# Patient Record
Sex: Female | Born: 1980
Health system: Southern US, Community
[De-identification: ages and names within clinical notes are randomized; demographics above are authoritative.]

## PROBLEM LIST (undated history)

## (undated) ENCOUNTER — Inpatient Hospital Stay (HOSPITAL_COMMUNITY): Payer: Self-pay

## (undated) ENCOUNTER — Emergency Department (HOSPITAL_COMMUNITY): Discharge status: Absent without leave | Payer: 59

## (undated) DIAGNOSIS — I10 Essential (primary) hypertension: Secondary | ICD-10-CM

## (undated) DIAGNOSIS — A6000 Herpesviral infection of urogenital system, unspecified: Secondary | ICD-10-CM

## (undated) DIAGNOSIS — O24419 Gestational diabetes mellitus in pregnancy, unspecified control: Secondary | ICD-10-CM

## (undated) DIAGNOSIS — J45909 Unspecified asthma, uncomplicated: Secondary | ICD-10-CM

## (undated) HISTORY — PX: MOUTH SURGERY: SHX715

---

## 2013-12-03 ENCOUNTER — Emergency Department (INDEPENDENT_AMBULATORY_CARE_PROVIDER_SITE_OTHER)
Admission: EM | Admit: 2013-12-03 | Discharge: 2013-12-03 | Disposition: A | Discharge status: Home / Self Care | Payer: Medicaid Other | Source: Home / Self Care | Attending: Family Medicine | Admitting: Family Medicine

## 2013-12-03 ENCOUNTER — Other Ambulatory Visit (HOSPITAL_COMMUNITY)
Admission: RE | Admit: 2013-12-03 | Discharge: 2013-12-03 | Disposition: A | Discharge status: Home / Self Care | Payer: Medicaid Other | Source: Ambulatory Visit | Attending: Family Medicine | Admitting: Family Medicine

## 2013-12-03 ENCOUNTER — Encounter (HOSPITAL_COMMUNITY): Payer: Self-pay | Admitting: Emergency Medicine

## 2013-12-03 DIAGNOSIS — Z202 Contact with and (suspected) exposure to infections with a predominantly sexual mode of transmission: Secondary | ICD-10-CM

## 2013-12-03 DIAGNOSIS — Z113 Encounter for screening for infections with a predominantly sexual mode of transmission: Secondary | ICD-10-CM | POA: Insufficient documentation

## 2013-12-03 DIAGNOSIS — N760 Acute vaginitis: Secondary | ICD-10-CM | POA: Insufficient documentation

## 2013-12-03 HISTORY — DX: Essential (primary) hypertension: I10

## 2013-12-03 LAB — POCT URINALYSIS DIP (DEVICE)
Bilirubin Urine: NEGATIVE
GLUCOSE, UA: NEGATIVE mg/dL
KETONES UR: NEGATIVE mg/dL
LEUKOCYTES UA: NEGATIVE
Nitrite: NEGATIVE
PROTEIN: NEGATIVE mg/dL
Specific Gravity, Urine: >=1.03 (ref 1.005–1.030)
Urobilinogen, UA: 1 mg/dL (ref 0.0–1.0)
pH: 6 (ref 5.0–8.0)

## 2013-12-03 LAB — POCT PREGNANCY, URINE: Preg Test, Ur: NEGATIVE

## 2013-12-03 MED ORDER — AZITHROMYCIN 250 MG PO TABS
1000.0000 mg | ORAL_TABLET | Freq: Every day | ORAL | Status: DC
  Administered 2013-12-03: 1000 mg via ORAL

## 2013-12-03 MED ORDER — CEFTRIAXONE SODIUM 250 MG IJ SOLR
250.0000 mg | Freq: Once | INTRAMUSCULAR | Status: AC
  Administered 2013-12-03: 250 mg via INTRAMUSCULAR

## 2013-12-03 MED ORDER — AZITHROMYCIN 250 MG PO TABS
ORAL_TABLET | ORAL | Status: AC
  Filled 2013-12-03: qty 4

## 2013-12-03 MED ORDER — CEFTRIAXONE SODIUM 250 MG IJ SOLR
INTRAMUSCULAR | Status: AC
  Filled 2013-12-03: qty 250

## 2013-12-03 MED ORDER — LIDOCAINE HCL (PF) 1 % IJ SOLN
INTRAMUSCULAR | Status: AC
  Filled 2013-12-03: qty 5

## 2013-12-03 NOTE — Discharge Instructions (Signed)
Sexually Transmitted Disease A sexually transmitted disease (STD) is a disease or infection that may be passed (transmitted) from person to person, usually during sexual activity. This may happen by way of saliva, semen, blood, vaginal mucus, or urine. Common STDs include:   Gonorrhea.   Chlamydia.   Syphilis.   HIV and AIDS.   Genital herpes.   Hepatitis B and C.   Trichomonas.   Human papillomavirus (HPV).   Pubic lice.   Scabies.  Mites.  Bacterial vaginosis. WHAT ARE CAUSES OF STDs? An STD may be caused by bacteria, a virus, or parasites. STDs are often transmitted during sexual activity if one person is infected. However, they may also be transmitted through nonsexual means. STDs may be transmitted after:   Sexual intercourse with an infected person.   Sharing sex toys with an infected person.   Sharing needles with an infected person or using unclean piercing or tattoo needles.  Having intimate contact with the genitals, mouth, or rectal areas of an infected person.   Exposure to infected fluids during birth. WHAT ARE THE SIGNS AND SYMPTOMS OF STDs? Different STDs have different symptoms. Some people may not have any symptoms. If symptoms are present, they may include:   Painful or bloody urination.   Pain in the pelvis, abdomen, vagina, anus, throat, or eyes.   Skin rash, itching, irritation, growths, sores (lesions), ulcerations, or warts in the genital or anal area.  Abnormal vaginal discharge with or without bad odor.   Penile discharge in men.   Fever.   Pain or bleeding during sexual intercourse.   Swollen glands in the groin area.   Yellow skin and eyes (jaundice). This is seen with hepatitis.   Swollen testicles.  Infertility.  Sores and blisters in the mouth. HOW ARE STDs DIAGNOSED? To make a diagnosis, your health care provider may:   Take a medical history.   Perform a physical exam.   Take a sample of any  discharge for examination.  Swab the throat, cervix, opening to the penis, rectum, or vagina for testing.  Test a sample of your first morning urine.   Perform blood tests.   Perform a Pap smear, if this applies.   Perform a colposcopy.   Perform a laparoscopy.  HOW ARE STDs TREATED? Treatment depends on the STD. Some STDs may be treated but not cured.   Chlamydia, gonorrhea, trichomonas, and syphilis can be cured with antibiotics.   Genital herpes, hepatitis, and HIV can be treated, but not cured, with prescribed medicines. The medicines lessen symptoms.   Genital warts from HPV can be treated with medicine or by freezing, burning (electrocautery), or surgery. Warts may come back.   HPV cannot be cured with medicine or surgery. However, abnormal areas may be removed from the cervix, vagina, or vulva.   If your diagnosis is confirmed, your recent sexual partners need treatment. This is true even if they are symptom-free or have a negative culture or evaluation. They should not have sex until their health care providers say it is OK. HOW CAN I REDUCE MY RISK OF GETTING AN STD?  Use latex condoms, dental dams, and water-soluble lubricants during sexual activity. Do not use petroleum jelly or oils.  Get vaccinated for HPV and hepatitis. If you have not received these vaccines in the past, talk to your health care provider about whether one or both might be right for you.   Avoid risky sex practices that can break the skin.  WHAT SHOULD   I DO IF I THINK I HAVE AN STD?  See your health care provider.   Inform all sexual partners. They should be tested and treated for any STDs.  Do not have sex until your health care provider says it is OK. WHEN SHOULD I GET HELP? Seek immediate medical care if:  You develop severe abdominal pain.  You are a man and notice swelling or pain in the testicles.  You are a woman and notice swelling or pain in your vagina. Document  Released: 11/06/2002 Document Revised: 06/06/2013 Document Reviewed: 03/06/2013 [employer] Patient Information 2014 [employer].  

## 2013-12-03 NOTE — ED Notes (Signed)
Reports being told by her partner that he tested positive for an std.    Pt is c/o a discharge and odor.  Denies any other symptoms.  X 2 days.

## 2013-12-03 NOTE — ED Provider Notes (Signed)
CSN: [id number]     Arrival date & time 12/03/13  1757 History   First MD Initiated Contact with Patient 12/03/13 1852     Chief Complaint  Patient presents with  . Exposure to STD   (Consider location/radiation/quality/duration/timing/severity/associated sxs/prior Treatment) HPI Comments: 33 year old female presents for evaluation after being told by her partner that he tested positive for an STD.  This conversation occurred earlier today. He did not tell her what he tested positive for. She has noted a slight malodorous vaginal discharge, but she states she just finished her period and this occurs after every period. She also has noted some mild lower abdominal pain but she just started working out so she is reassessed the abdominal pain to muscular soreness. She denies fever, chills, NVD. Denies vaginal bleeding.  Patient is a 33 y.o. female presenting with STD exposure.  Exposure to STD Associated symptoms include abdominal pain.    Past Medical History  Diagnosis Date  . Hypertension    History reviewed. No pertinent past surgical history. History reviewed. No pertinent family history. History  Substance Use Topics  . Smoking status: Current Every Day Smoker -- 0.50 packs/day    Types: Cigarettes  . Smokeless tobacco: Not on file  . Alcohol Use: Yes   OB History   Grav Para Term Preterm Abortions TAB SAB Ect Mult Living                 Review of Systems  Constitutional: Negative for fever and chills.  Gastrointestinal: Positive for abdominal pain. Negative for nausea, vomiting and diarrhea.  Genitourinary: Positive for vaginal discharge. Negative for dysuria, urgency, frequency, genital sores, vaginal pain and pelvic pain.  All other systems reviewed and are negative.    Allergies  Review of patient's allergies indicates no known allergies.  Home Medications   Current Outpatient Rx  Name  Route  Sig  Dispense  Refill  . HYDROCHLOROTHIAZIDE PO   Oral   Take by  mouth.          BP 181/99  Pulse 64  Temp(Src) 97.5 F (36.4 C) (Oral)  Resp 16  SpO2 100%  LMP 11/24/2013 Physical Exam  Nursing note and vitals reviewed. Constitutional: She is oriented to person, place, and time. Vital signs are normal. She appears well-developed and well-nourished. No distress.  HENT:  Head: Normocephalic and atraumatic.  Pulmonary/Chest: Effort normal. No respiratory distress.  Abdominal: Soft. She exhibits no distension and no mass. There is no tenderness. There is no rebound and no guarding.  Genitourinary: There is no tenderness or lesion on the right labia. There is no tenderness or lesion on the left labia. Cervix exhibits no motion tenderness, no discharge and no friability. Right adnexum displays no mass, no tenderness and no fullness. Left adnexum displays no mass, no tenderness and no fullness. No erythema or bleeding around the vagina. No foreign body around the vagina. Vaginal discharge (very slight thin white discharge, may be physiologic) found.  Lymphadenopathy:       Right: No inguinal adenopathy present.       Left: No inguinal adenopathy present.  Neurological: She is alert and oriented to person, place, and time. She has normal strength. Coordination normal.  Skin: Skin is warm and dry. No rash noted. She is not diaphoretic.  Psychiatric: She has a normal mood and affect. Judgment normal.    ED Course  Procedures (including critical care time) Labs Review Labs Reviewed  POCT URINALYSIS DIP (DEVICE) - Abnormal; Notable  for the following:    Hgb urine dipstick SMALL (*)    All other components within normal limits  RPR  HIV ANTIBODY (ROUTINE TESTING)  POCT PREGNANCY, URINE  CERVICOVAGINAL ANCILLARY ONLY   Imaging Review No results found.   MDM   1. Exposure to STD    We'll treat with Rocephin and azithromycin for post exposure prophylaxis. Sending cytology, HIV, RPR. Followup when necessary  Meds ordered this encounter   Medications  . azithromycin (ZITHROMAX) tablet 1,000 mg    Sig:   . cefTRIAXone (ROCEPHIN) injection 250 mg    Sig:      Liam Graham, PA-C 12/03/13 1911

## 2013-12-03 NOTE — ED Provider Notes (Signed)
Medical screening examination/treatment/procedure(s) were performed by resident physician or non-physician practitioner and as supervising physician I was immediately available for consultation/collaboration.   Pauline Good MD.   Billy Fischer, MD 12/03/13 2126

## 2013-12-04 LAB — CERVICOVAGINAL ANCILLARY ONLY
CHLAMYDIA, DNA PROBE: NEGATIVE
NEISSERIA GONORRHEA: NEGATIVE
WET PREP (BD AFFIRM): NEGATIVE
Wet Prep (BD Affirm): NEGATIVE
Wet Prep (BD Affirm): POSITIVE — AB

## 2013-12-04 LAB — RPR: RPR Ser Ql: NONREACTIVE

## 2013-12-05 NOTE — ED Notes (Signed)
GC/Chlamydia neg., Affirm: Candida and Trich neg., Gardnerella pos., RPR non-reactive, HIV in process. 4/7 Message sent to [employer] PA. Hanley Seamen [facility] [place] 12/05/2013

## 2013-12-06 ENCOUNTER — Telehealth (HOSPITAL_COMMUNITY): Payer: Self-pay | Admitting: *Deleted

## 2013-12-06 ENCOUNTER — Telehealth (HOSPITAL_COMMUNITY): Payer: Self-pay | Admitting: Emergency Medicine

## 2013-12-06 LAB — HIV ANTIBODY (ROUTINE TESTING W REFLEX): HIV: NONREACTIVE

## 2013-12-06 MED ORDER — METRONIDAZOLE 500 MG PO TABS: 500.0000 mg | ORAL_TABLET | Freq: Two times a day (BID) | ORAL | Status: DC

## 2013-12-06 NOTE — Telephone Encounter (Signed)
Natale Milch, RN received lab for pt positive for Gardnerella. Pt was not tx. Rx flagyl 500mg  BID #[id number]. S. York, RN will contact pt to pick up rx.

## 2013-12-06 NOTE — ED Notes (Signed)
Order obtained from Willeen Cass NP for Flagyl.  I called pt. and left a message to call. Sylvia Baxter 12/06/2013

## 2013-12-06 NOTE — ED Notes (Addendum)
Pt. called back.  Pt. verified x 2 and given results. Pt. said she can see her results on my chart except the HIV. I told her it was still in process. If it comes back pos. I would notify her.  I told her she needs Flagyl for bacterial vaginosis.  Pt. wants Rx. called to [facility] on [address].   Pt. instructed to no alcohol while taking this medication.  I told her it should be ready in about an hour.  Rx. called to pharmacist @ [phone]. [name] 12/06/2013 HIV non-reactive. 12/06/2013

## 2014-08-12 ENCOUNTER — Encounter (HOSPITAL_COMMUNITY): Payer: Self-pay | Admitting: Emergency Medicine

## 2014-08-12 ENCOUNTER — Other Ambulatory Visit (HOSPITAL_COMMUNITY)
Admission: RE | Admit: 2014-08-12 | Discharge: 2014-08-12 | Disposition: A | Discharge status: Home / Self Care | Payer: 59 | Source: Ambulatory Visit | Attending: Emergency Medicine | Admitting: Emergency Medicine

## 2014-08-12 ENCOUNTER — Emergency Department (INDEPENDENT_AMBULATORY_CARE_PROVIDER_SITE_OTHER)
Admission: EM | Admit: 2014-08-12 | Discharge: 2014-08-12 | Disposition: A | Discharge status: Home / Self Care | Payer: 59 | Source: Home / Self Care | Attending: Emergency Medicine | Admitting: Emergency Medicine

## 2014-08-12 DIAGNOSIS — B009 Herpesviral infection, unspecified: Secondary | ICD-10-CM

## 2014-08-12 DIAGNOSIS — N760 Acute vaginitis: Secondary | ICD-10-CM

## 2014-08-12 DIAGNOSIS — J4520 Mild intermittent asthma, uncomplicated: Secondary | ICD-10-CM

## 2014-08-12 DIAGNOSIS — Z113 Encounter for screening for infections with a predominantly sexual mode of transmission: Secondary | ICD-10-CM | POA: Insufficient documentation

## 2014-08-12 DIAGNOSIS — I10 Essential (primary) hypertension: Secondary | ICD-10-CM

## 2014-08-12 HISTORY — DX: Herpesviral infection of urogenital system, unspecified: A60.00

## 2014-08-12 LAB — POCT URINALYSIS DIP (DEVICE)
Bilirubin Urine: NEGATIVE
Glucose, UA: NEGATIVE mg/dL
KETONES UR: NEGATIVE mg/dL
Nitrite: NEGATIVE
PROTEIN: 30 mg/dL — AB
Specific Gravity, Urine: >=1.03 (ref 1.005–1.030)
Urobilinogen, UA: 1 mg/dL (ref 0.0–1.0)
pH: 6 (ref 5.0–8.0)

## 2014-08-12 LAB — POCT PREGNANCY, URINE: PREG TEST UR: NEGATIVE

## 2014-08-12 MED ORDER — ALBUTEROL SULFATE HFA 108 (90 BASE) MCG/ACT IN AERS: 2.0000 | INHALATION_SPRAY | RESPIRATORY_TRACT | Status: AC | PRN

## 2014-08-12 MED ORDER — METRONIDAZOLE 500 MG PO TABS: 500.0000 mg | ORAL_TABLET | Freq: Two times a day (BID) | ORAL | Status: DC

## 2014-08-12 MED ORDER — VALACYCLOVIR HCL 1 G PO TABS: 500.0000 mg | ORAL_TABLET | Freq: Two times a day (BID) | ORAL | Status: DC

## 2014-08-12 MED ORDER — HYDROCHLOROTHIAZIDE 25 MG PO TABS: 25.0000 mg | ORAL_TABLET | Freq: Every day | ORAL | Status: DC

## 2014-08-12 MED ORDER — FLUCONAZOLE 150 MG PO TABS: 150.0000 mg | ORAL_TABLET | Freq: Once | ORAL | Status: DC

## 2014-08-12 NOTE — Discharge Instructions (Signed)
Asthma Attack Prevention Although there is no way to prevent asthma from starting, you can take steps to control the disease and reduce its symptoms. Learn about your asthma and how to control it. Take an active role to control your asthma by working with your health care provider to create and follow an asthma action plan. An asthma action plan guides you in:  Taking your medicines properly.  Avoiding things that set off your asthma or make your asthma worse (asthma triggers).  Tracking your level of asthma control.  Responding to worsening asthma.  Seeking emergency care when needed. To track your asthma, keep records of your symptoms, check your peak flow number using a handheld device that shows how well air moves out of your lungs (peak flow meter), and get regular asthma checkups.  WHAT ARE SOME WAYS TO PREVENT AN ASTHMA ATTACK?  Take medicines as directed by your health care provider.  Keep track of your asthma symptoms and level of control.  With your health care provider, write a detailed plan for taking medicines and managing an asthma attack. Then be sure to follow your action plan. Asthma is an ongoing condition that needs regular monitoring and treatment.  Identify and avoid asthma triggers. Many outdoor allergens and irritants (such as pollen, mold, cold air, and air pollution) can trigger asthma attacks. Find out what your asthma triggers are and take steps to avoid them.  Monitor your breathing. Learn to recognize warning signs of an attack, such as coughing, wheezing, or shortness of breath. Your lung function may decrease before you notice any signs or symptoms, so regularly measure and record your peak airflow with a home peak flow meter.  Identify and treat attacks early. If you act quickly, you are less likely to have a severe attack. You will also need less medicine to control your symptoms. When your peak flow measurements decrease and alert you to an upcoming attack,  take your medicine as instructed and immediately stop any activity that may have triggered the attack. If your symptoms do not improve, get medical help.  Pay attention to increasing quick-relief inhaler use. If you find yourself relying on your quick-relief inhaler, your asthma is not under control. See your health care provider about adjusting your treatment. WHAT CAN MAKE MY SYMPTOMS WORSE? A number of common things can set off or make your asthma symptoms worse and cause temporary increased inflammation of your airways. Keep track of your asthma symptoms for several weeks, detailing all the environmental and emotional factors that are linked with your asthma. When you have an asthma attack, go back to your asthma diary to see which factor, or combination of factors, might have contributed to it. Once you know what these factors are, you can take steps to control many of them. If you have allergies and asthma, it is important to take asthma prevention steps at home. Minimizing contact with the substance to which you are allergic will help prevent an asthma attack. Some triggers and ways to avoid these triggers are: Animal Dander:  Some people are allergic to the flakes of skin or dried saliva from animals with fur or feathers.   There is no such thing as a hypoallergenic dog or cat breed. All dogs or cats can cause allergies, even if they don't shed.  Keep these pets out of your home.  If you are not able to keep a pet outdoors, keep the pet out of your bedroom and other sleeping areas at all  times, and keep the door closed.  Remove carpets and furniture covered with cloth from your home. If that is not possible, keep the pet away from fabric-covered furniture and carpets. Dust Mites: Many people with asthma are allergic to dust mites. Dust mites are tiny bugs that are found in every home in mattresses, pillows, carpets, fabric-covered furniture, bedcovers, clothes, stuffed toys, and other  fabric-covered items.   Cover your mattress in a special dust-proof cover.  Cover your pillow in a special dust-proof cover, or wash the pillow each week in hot water. Water must be hotter than 130 F (54.4 C) to kill dust mites. Cold or warm water used with detergent and bleach can also be effective.  Wash the sheets and blankets on your bed each week in hot water.  Try not to sleep or lie on cloth-covered cushions.  Call ahead when traveling and ask for a smoke-free hotel room. Bring your own bedding and pillows in case the hotel only supplies feather pillows and down comforters, which may contain dust mites and cause asthma symptoms.  Remove carpets from your bedroom and those laid on concrete, if you can.  Keep stuffed toys out of the bed, or wash the toys weekly in hot water or cooler water with detergent and bleach. Cockroaches: Many people with asthma are allergic to the droppings and remains of cockroaches.   Keep food and garbage in closed containers. Never leave food out.  Use poison baits, traps, powders, gels, or paste (for example, boric acid).  If a spray is used to kill cockroaches, stay out of the room until the odor goes away. Indoor Mold: 1. Fix leaky faucets, pipes, or other sources of water that have mold around them. 2. Clean floors and moldy surfaces with a fungicide or diluted bleach. 3. Avoid using humidifiers, vaporizers, or swamp coolers. These can spread molds through the air. Pollen and Outdoor Mold: 1. When pollen or mold spore counts are high, try to keep your windows closed. 2. Stay indoors with windows closed from late morning to afternoon. Pollen and some mold spore counts are highest at that time. 3. Ask your health care provider whether you need to take anti-inflammatory medicine or increase your dose of the medicine before your allergy season starts. Other Irritants to Avoid:  Tobacco smoke is an irritant. If you smoke, ask your health care  provider how you can quit. Ask family members to quit smoking, too. Do not allow smoking in your home or car.  If possible, do not use a wood-burning stove, kerosene heater, or fireplace. Minimize exposure to all sources of smoke, including incense, candles, fires, and fireworks.  Try to stay away from strong odors and sprays, such as perfume, talcum powder, hair spray, and paints.  Decrease humidity in your home and use an indoor air cleaning device. Reduce indoor humidity to below 60%. Dehumidifiers or central air conditioners can do this.  Decrease house dust exposure by changing furnace and air cooler filters frequently.  Try to have someone else vacuum for you once or twice a week. Stay out of rooms while they are being vacuumed and for a short while afterward.  If you vacuum, use a dust mask from a hardware store, a double-layered or microfilter vacuum cleaner bag, or a vacuum cleaner with a HEPA filter.  Sulfites in foods and beverages can be irritants. Do not drink beer or wine or eat dried fruit, processed potatoes, or shrimp if they cause asthma symptoms.  Cold  air can trigger an asthma attack. Cover your nose and mouth with a scarf on cold or windy days.  Several health conditions can make asthma more difficult to manage, including a runny nose, sinus infections, reflux disease, psychological stress, and sleep apnea. Work with your health care provider to manage these conditions.  Avoid close contact with people who have a respiratory infection such as a cold or the flu, since your asthma symptoms may get worse if you catch the infection. Wash your hands thoroughly after touching items that may have been handled by people with a respiratory infection.  Get a flu shot every year to protect against the flu virus, which often makes asthma worse for days or weeks. Also get a pneumonia shot if you have not previously had one. Unlike the flu shot, the pneumonia shot does not need to be  given yearly. Medicines:  Talk to your health care provider about whether it is safe for you to take aspirin or non-steroidal anti-inflammatory medicines (NSAIDs). In a small number of people with asthma, aspirin and NSAIDs can cause asthma attacks. These medicines must be avoided by people who have known aspirin-sensitive asthma. It is important that people with aspirin-sensitive asthma read labels of all over-the-counter medicines used to treat pain, colds, coughs, and fever.  Beta-blockers and ACE inhibitors are other medicines you should discuss with your health care provider. HOW CAN I FIND OUT WHAT I AM ALLERGIC TO? Ask your asthma health care provider about allergy skin testing or blood testing (the RAST test) to identify the allergens to which you are sensitive. If you are found to have allergies, the most important thing to do is to try to avoid exposure to any allergens that you are sensitive to as much as possible. Other treatments for allergies, such as medicines and allergy shots (immunotherapy) are available.  CAN I EXERCISE? Follow your health care provider's advice regarding asthma treatment before exercising. It is important to maintain a regular exercise program, but vigorous exercise or exercise in cold, humid, or dry environments can cause asthma attacks, especially for those people who have exercise-induced asthma. Document Released: 08/04/2009 Document Revised: 08/21/2013 Document Reviewed: 02/21/2013 [facility] Patient Information 2015 [place], [place]. This information is not intended to replace advice given to you by your health care provider. Make sure you discuss any questions you have with your health care provider.  Blood pressure over the ideal can put you at higher risk for stroke, heart disease, and kidney failure.  For this reason, it's important to try to get your blood pressure as close as possible to the ideal.  The ideal blood pressure is 120/80.  Blood pressures from  025-427 systolic over 06-23 diastolic are labeled as "prehypertension."  This means you are at higher risk of developing hypertension in the future.  Blood pressures in this range are not treated with medication, but lifestyle changes are recommended to prevent progression to hypertension.  Blood pressures of 762 and above systolic over 90 and above diastolic are classified as hypertension and are treated with medications.  Lifestyle changes which can benefit both prehypertension and hypertension include the following:   Salt and sodium restriction.  Weight loss.  Regular exercise.  Avoidance of tobacco.  Avoidance of excess alcohol.  The "D.A.S.H" diet.   People with hypertension and prehypertension should limit their salt intake to less than 1500 mg daily.  Reading the nutrition information on the label of many prepared foods can give you an idea of how  much sodium you're consuming at each meal.  Remember that the most important number on the nutrition information is the serving size.  It may be smaller than you think.  Try to avoid adding extra salt at the table.  You may add small amounts of salt while cooking.  Remember that salt is an acquired taste and you may get used to a using a whole lot less salt than you are using now.  Using less salt lets the food's natural flavors come through.  You might want to consider using salt substitutes, potassium chloride, pepper, or blends of herbs and spices to enhance the flavor of your food.  Foods that contain the most salt include: processed meats (like ham, bacon, lunch meat, sausage, hot dogs, and breakfast meat), chips, pretzels, salted nuts, soups, salty snacks, canned foods, junk food, fast food, restaurant food, mustard, pickles, pizza, popcorn, soy sauce, and worcestershire sauce--quite a list!  You might ask, "Is there anything I can eat?"  The answer is, "yes."  Fruits and vegetables are usually low in salt.  Fresh is better than frozen which  is better than canned.  If you have canned vegetables, you can cut down on the salt content by rinsing them in tap water 3 times before cooking.     Weight loss is the second thing you can do to lower your blood pressure.  Getting to and maintaining ideal weight will often normalize your blood pressure and allow you to avoid medications, entirely, cut way down on your dosage of medications, or allow to wean off your meds.  (Note, this should only be done under the supervision of your primary care doctor.)  Of course, weight loss takes time and you may need to be on medication in the meantime.  You shoot for a body mass index of 20-25.  When you go to the urgent care or to your primary care doctor, they should calculate your BMI.  If you don't know what it is, ask.  You can calculate your BMI with the following formula:  Weight in pounds x 703/ (height in inches) x (height in inches).  There are many good diets out there: Weight Watchers and the D.A.S.H. Diet are the best, but often, just modifying a few factors can be helpful:  Don't skip meals, don't eat out, and keeping a food diary.  I do not recommend fad diets or diet pills which often raise blood pressure.    Everyone should get regular exercise, but this is particularly important for people with high blood pressure.  Just about any exercise is good.  The only exercise which may be harmful is lifting extreme heavy weights.  I recommend moderate exercise such as walking for 30 minutes 5 days a week.  Going to the gym for a 50 minute workout 3 times a week is also good.  This amounts to 150 minutes of exercise weekly.   Anyone with high blood pressure should avoid any use of tobacco.  Tobacco use does not elevate blood pressure, but it increases the risk of heart disease and stroke.  If you are interested in quitting, discuss with your doctor how to quit.  If you are not interested in quitting, ask yourself, "What would my life be like in 10 years if I  continue to smoke?"  "How will I know when it is time to quit?"  "How would my life be better if I were to quit."   Excess alcohol intake can raise  the blood pressure.  The safe alcohol intake is 2 drinks or less per day for men and 1 drink per day or less for women.   There is a very good diet which I recommend that has been designed for people with blood pressure called the D.A.S.H. Diet (dietary approaches to stop hypertension).  It consists of fruits, vegetables, lean meats, low fat dairy, whole grains, nuts and seeds.  It is very low in salt and sodium.  It has also been found to have other beneficial health effects such as lowering cholesterol and helping lose weight.  It has been developed by the [employer] and can be downloaded from the internet without any cost. Just do a Development worker, community on "D.A.S.H. Diet." or go the NIH website (MasterBoxes.it).  There are also cookbooks and diet plans that can be gotten from [place] to help you with this diet.   Bacterial Vaginosis Bacterial vaginosis is a vaginal infection that occurs when the normal balance of bacteria in the vagina is disrupted. It results from an overgrowth of certain bacteria. This is the most common vaginal infection in women of childbearing age. Treatment is important to prevent complications, especially in pregnant women, as it can cause a premature delivery. CAUSES  Bacterial vaginosis is caused by an increase in harmful bacteria that are normally present in smaller amounts in the vagina. Several different kinds of bacteria can cause bacterial vaginosis. However, the reason that the condition develops is not fully understood. RISK FACTORS Certain activities or behaviors can put you at an increased risk of developing bacterial vaginosis, including:  Having a new sex partner or multiple sex partners.  Douching.  Using an intrauterine device (IUD) for contraception. Women do not get bacterial vaginosis from toilet  seats, bedding, swimming pools, or contact with objects around them. SIGNS AND SYMPTOMS  Some women with bacterial vaginosis have no signs or symptoms. Common symptoms include:  Grey vaginal discharge.  A fishlike odor with discharge, especially after sexual intercourse.  Itching or burning of the vagina and vulva.  Burning or pain with urination. DIAGNOSIS  Your health care provider will take a medical history and examine the vagina for signs of bacterial vaginosis. A sample of vaginal fluid may be taken. Your health care provider will look at this sample under a microscope to check for bacteria and abnormal cells. A vaginal pH test may also be done.  TREATMENT  Bacterial vaginosis may be treated with antibiotic medicines. These may be given in the form of a pill or a vaginal cream. A second round of antibiotics may be prescribed if the condition comes back after treatment.  HOME CARE INSTRUCTIONS   Only take over-the-counter or prescription medicines as directed by your health care provider.  If antibiotic medicine was prescribed, take it as directed. Make sure you finish it even if you start to feel better.  Do not have sex until treatment is completed.  Tell all sexual partners that you have a vaginal infection. They should see their health care provider and be treated if they have problems, such as a mild rash or itching.  Practice safe sex by using condoms and only having one sex partner. SEEK MEDICAL CARE IF:   Your symptoms are not improving after 3 days of treatment.  You have increased discharge or pain.  You have a fever. MAKE SURE YOU:   Understand these instructions.  Will watch your condition.  Will get help right away if you are  not doing well or get worse. FOR MORE INFORMATION  Centers for Disease Control and Prevention, Division of STD Prevention: AppraiserFraud.fi American Sexual Health Association (ASHA): www.ashastd.org  Document Released: 08/16/2005  Document Revised: 06/06/2013 Document Reviewed: 03/28/2013 [facility] Patient Information 2015 [place], [place]. This information is not intended to replace advice given to you by your health care provider. Make sure you discuss any questions you have with your health care provider.

## 2014-08-12 NOTE — ED Provider Notes (Signed)
Chief Complaint   Vaginal Discharge   History of Present Illness   Analyse Sylvia Baxter is a 33 year old female who has had a two-day history of a whitish, itchy vaginal discharge. She's had some vulvar irritation and some pelvic pain. She also has had some lower back pain. She's had some aching in her groin. She has a history of herpes simplex virus. She gets flareups of this from time to time and has Valtrex, but she's run out only as been able to take 2 tablets of this. She notes whenever she has a flareup of her herpes she gets pain in her groin areas and lymphadenopathy. Yesterday she had some spotting. Her last menstrual period was November 26. She is sexually active without use of birth control but is 100% sure that she is not pregnant.  Review of Systems   Other than as noted above, the patient denies any of the following symptoms: Systemic:  No fever or chills GI:  No abdominal pain, nausea, vomiting, diarrhea, constipation, melena or hematochezia. GU:  No dysuria, frequency, urgency, hematuria, vaginal discharge, itching, or abnormal vaginal bleeding.  Sylvia Baxter   Past medical history, family history, social history, meds, and allergies were reviewed.  She has a history of high blood pressure and is on HCTZ 25 mg a day and asthma and is on albuterol. She needs refills of both of these as well as Valtrex. She does not have a primary care physician in [place].  Physical Examination    Vital signs:  BP 164/106 mmHg  Pulse 70  Temp(Src) 99.2 F (37.3 C) (Oral)  Resp 16  SpO2 100%  LMP 07/25/2014 General:  Alert, oriented and in no distress. Lungs:  Breath sounds clear and equal bilaterally.  No wheezes, rales or rhonchi. Heart:  Regular rhythm.  No gallops or murmers. Abdomen:  Soft, flat and non-distended.  No organomegaly or mass.  No tenderness, guarding or rebound.  Bowel sounds normally active. Pelvic exam:  There was an irritated area at the 12:00 position on the vulva.  This was not ulcerated. There are no ulcerations, sores, or blisters. Vaginal mucosa was normal and there was a scant amount of whitish, malodorous discharge. Cervix was normal. No pain on cervical motion. Uterus was normal in size and shape and nontender. No adnexal masses or tenderness.  DNA probes for gonorrhea, Chlamydia, Trichomonas, Gardnerella, Candida were obtained. Skin:  Clear, warm and dry.  Chaperoned by Cheral Bay, RN who was present throughout the pelvic exam.   Labs   Results for orders placed or performed during the hospital encounter of 08/12/14  POCT urinalysis dip (device)  Result Value Ref Range   Glucose, UA NEGATIVE NEGATIVE mg/dL   Bilirubin Urine NEGATIVE NEGATIVE   Ketones, ur NEGATIVE NEGATIVE mg/dL   Specific Gravity, Urine >=1.030 1.005 - 1.030   Hgb urine dipstick MODERATE (A) NEGATIVE   pH 6.0 5.0 - 8.0   Protein, ur 30 (A) NEGATIVE mg/dL   Urobilinogen, UA 1.0 0.0 - 1.0 mg/dL   Nitrite NEGATIVE NEGATIVE   Leukocytes, UA TRACE (A) NEGATIVE  Pregnancy, urine POC  Result Value Ref Range   Preg Test, Ur NEGATIVE NEGATIVE    Plan    1.  Meds:  The following meds were prescribed:   Discharge Medication List as of 08/12/2014  9:47 AM    START taking these medications   Details  albuterol (PROVENTIL HFA;VENTOLIN HFA) 108 (90 BASE) MCG/ACT inhaler Inhale 2 puffs into the lungs every 4 (four)  hours as needed for wheezing or shortness of breath., Starting 08/12/2014, Until Discontinued, Normal    fluconazole (DIFLUCAN) 150 MG tablet Take 1 tablet (150 mg total) by mouth once., Starting 08/12/2014, Normal       She was also given prescriptions for HCTZ 25 mg, #90, one daily with 1 refill, Flagyl 500 mg, #14 one twice a day, albuterol HFA inhaler, 2 puffs every 4 hours as needed with 12 refills, and Valtrex 1000 mg, #7, one half tablet twice a day for 1 week with 12 refills.  2.  Patient Education/Counseling:  The patient was given appropriate handouts, self  care instructions, and instructed in symptomatic relief.    3.  Follow up:  The patient was told to follow up here if no better in 3 to 4 days, or sooner if becoming worse in any way, and given some red flag symptoms such as worsening pain, fever, persistent vomiting, or heavy vaginal bleeding which would prompt immediate return.       Harden Mo, MD 08/12/14 [phone]

## 2014-08-12 NOTE — ED Notes (Signed)
Reports feeling irritated and inflamed yesterday, reports vaginal discharge.  Has herpes, but does not feel like a breakout is occuring

## 2014-08-13 LAB — CERVICOVAGINAL ANCILLARY ONLY
Chlamydia: NEGATIVE
Neisseria Gonorrhea: NEGATIVE
Wet Prep (BD Affirm): NEGATIVE
Wet Prep (BD Affirm): POSITIVE — AB
Wet Prep (BD Affirm): POSITIVE — AB

## 2014-08-13 LAB — HIV ANTIBODY (ROUTINE TESTING W REFLEX): HIV: NONREACTIVE

## 2014-08-13 NOTE — ED Notes (Signed)
GC/Chlamydia neg., Affirm: Candida neg., Gardnerella and Trich pos.  Pt. adequately treated with Flagyl.  Needs notified. Roselyn Meier 08/13/2014

## 2014-08-14 ENCOUNTER — Telehealth (HOSPITAL_COMMUNITY): Payer: Self-pay | Admitting: *Deleted

## 2014-08-14 NOTE — ED Notes (Addendum)
I called and left a message to call. Call 1. Sylvia Baxter 08/14/2014 Pt. called back.  Pt. verified x 2 and given results.  Pt. said she saw them on mychart.  Pt. told she was adequately treated for both infections with Flagyl and to finish all of the medication.  Pt. instructed to notify her partner to be tested and treated for Trich with Flagyl, no sex until she has finished her medication and her partner has been treated and to practice safe sex. Instructed get HIV rechecked in 6 mos. at the [facility] clinic, by appointment. Sylvia Baxter  08/14/2014

## 2016-09-06 ENCOUNTER — Encounter (HOSPITAL_COMMUNITY): Payer: Self-pay | Admitting: Emergency Medicine

## 2016-09-06 ENCOUNTER — Emergency Department (HOSPITAL_COMMUNITY): Payer: 59

## 2016-09-06 DIAGNOSIS — Z79899 Other long term (current) drug therapy: Secondary | ICD-10-CM | POA: Insufficient documentation

## 2016-09-06 DIAGNOSIS — O209 Hemorrhage in early pregnancy, unspecified: Secondary | ICD-10-CM | POA: Diagnosis present

## 2016-09-06 DIAGNOSIS — Z3A01 Less than 8 weeks gestation of pregnancy: Secondary | ICD-10-CM | POA: Diagnosis not present

## 2016-09-06 DIAGNOSIS — F17210 Nicotine dependence, cigarettes, uncomplicated: Secondary | ICD-10-CM | POA: Insufficient documentation

## 2016-09-06 DIAGNOSIS — I10 Essential (primary) hypertension: Secondary | ICD-10-CM | POA: Diagnosis not present

## 2016-09-06 LAB — URINALYSIS, ROUTINE W REFLEX MICROSCOPIC
BACTERIA UA: NONE SEEN
Bilirubin Urine: NEGATIVE
Glucose, UA: NEGATIVE mg/dL
Ketones, ur: NEGATIVE mg/dL
Leukocytes, UA: NEGATIVE
Nitrite: NEGATIVE
PH: 7 (ref 5.0–8.0)
Protein, ur: NEGATIVE mg/dL
Specific Gravity, Urine: 1.013 (ref 1.005–1.030)

## 2016-09-06 NOTE — ED Triage Notes (Signed)
Pt reports having bleeding when wiping after using restroom. Pt denies any pain at this time. Pt currently appx [redacted]weeks pregnant. Pt reports having some discomfort with urination the several days ago.

## 2016-09-06 NOTE — ED Notes (Signed)
Called pt for v/s No response

## 2016-09-06 NOTE — ED Notes (Signed)
PT GONE TO ULTRASOUND AT TIME OF BLOOD DRAW.

## 2016-09-07 ENCOUNTER — Emergency Department (HOSPITAL_COMMUNITY)
Admission: EM | Admit: 2016-09-07 | Discharge: 2016-09-07 | Discharge status: Against Medical Advice | Payer: 59 | Attending: Emergency Medicine | Admitting: Emergency Medicine

## 2016-09-07 DIAGNOSIS — O4690 Antepartum hemorrhage, unspecified, unspecified trimester: Secondary | ICD-10-CM

## 2016-09-07 LAB — WET PREP, GENITAL
CLUE CELLS WET PREP: NONE SEEN
SPERM: NONE SEEN
TRICH WET PREP: NONE SEEN
Yeast Wet Prep HPF POC: NONE SEEN

## 2016-09-07 LAB — CBC WITH DIFFERENTIAL/PLATELET
BASOS PCT: 0 %
Basophils Absolute: 0 10*3/uL (ref 0.0–0.1)
EOS ABS: 0.2 10*3/uL (ref 0.0–0.7)
EOS PCT: 1 %
HCT: 37.3 % (ref 36.0–46.0)
Hemoglobin: 12.4 g/dL (ref 12.0–15.0)
LYMPHS ABS: 4.1 10*3/uL — AB (ref 0.7–4.0)
Lymphocytes Relative: 29 %
MCH: 28.2 pg (ref 26.0–34.0)
MCHC: 33.2 g/dL (ref 30.0–36.0)
MCV: 84.8 fL (ref 78.0–100.0)
Monocytes Absolute: 0.8 10*3/uL (ref 0.1–1.0)
Monocytes Relative: 6 %
Neutro Abs: 8.9 10*3/uL — ABNORMAL HIGH (ref 1.7–7.7)
Neutrophils Relative %: 64 %
PLATELETS: 289 10*3/uL (ref 150–400)
RBC: 4.4 MIL/uL (ref 3.87–5.11)
RDW: 13.2 % (ref 11.5–15.5)
WBC: 14.1 10*3/uL — ABNORMAL HIGH (ref 4.0–10.5)

## 2016-09-07 LAB — BASIC METABOLIC PANEL
Anion gap: 4 — ABNORMAL LOW (ref 5–15)
BUN: 13 mg/dL (ref 6–20)
CHLORIDE: 105 mmol/L (ref 101–111)
CO2: 26 mmol/L (ref 22–32)
CREATININE: 1.01 mg/dL — AB (ref 0.44–1.00)
Calcium: 9 mg/dL (ref 8.9–10.3)
GFR calc Af Amer: >60 mL/min (ref >60)
GFR calc non Af Amer: >60 mL/min (ref >60)
GLUCOSE: 109 mg/dL — AB (ref 65–99)
POTASSIUM: 3.5 mmol/L (ref 3.5–5.1)
Sodium: 135 mmol/L (ref 135–145)

## 2016-09-07 LAB — HCG, QUANTITATIVE, PREGNANCY: hCG, Beta Chain, Quant, S: 38914 m[IU]/mL — ABNORMAL HIGH (ref <5)

## 2016-09-07 LAB — ABO/RH: ABO/RH(D): O POS

## 2016-09-07 LAB — TYPE AND SCREEN
ABO/RH(D): O POS
ANTIBODY SCREEN: NEGATIVE

## 2016-09-07 LAB — HIV ANTIBODY (ROUTINE TESTING W REFLEX): HIV SCREEN 4TH GENERATION: NONREACTIVE

## 2016-09-07 NOTE — ED Notes (Signed)
Pt just wanted to leave and be back w her kids

## 2016-09-07 NOTE — ED Provider Notes (Signed)
[facility] DEPT Provider Note   CSN: [id number] Arrival date & time: 09/06/16  1931    By signing my name below, I, Sylvia Baxter, attest that this documentation has been prepared under the direction and in the presence of  Sylvia Creamer, NP. Electronically Signed: Macon Baxter, ED Scribe. 09/07/16. 1:52 AM.  History   Chief Complaint Chief Complaint  Patient presents with  . Vaginal Bleeding   The history is provided by the patient. No language interpreter was used.   HPI Comments: Sylvia Baxter is a 36 y.o. female with PMHx of HTN, who presents to the Emergency Department complaining of moderate, constant, vaginal bleeding that began yesterday evening ~7pm. She notes seeing mild bleeding after wiping when using the restroom. Pt states she has been using pads, but does not completely fill them up. Per pt, she notes having some discomfort when urinating that occurred about a week ago. She notes this has subsided since. Denies fever, dysuria, abdominal cramping, difficulty urinating. Pt states she has had two pregnancies in the past without difficulty.   Past Medical History:  Diagnosis Date  . Genital herpes   . Hypertension     There are no active problems to display for this patient.   History reviewed. No pertinent surgical history.  OB History    Gravida Para Term Preterm AB Living   4 2 2     2    SAB TAB Ectopic Multiple Live Births           2       Home Medications    Prior to Admission medications   Medication Sig Start Date End Date Taking? Authorizing Provider  albuterol (PROVENTIL HFA;VENTOLIN HFA) 108 (90 BASE) MCG/ACT inhaler Inhale 2 puffs into the lungs every 4 (four) hours as needed for wheezing or shortness of breath. 08/12/14  Yes Harden Mo, MD  folic acid (FOLVITE) 1 MG tablet Take 1 mg by mouth daily.   Yes Historical Provider, MD  hydrochlorothiazide (HYDRODIURIL) 25 MG tablet Take 1 tablet (25 mg total) by mouth daily. 08/12/14   Yes Harden Mo, MD  fluconazole (DIFLUCAN) 150 MG tablet Take 1 tablet (150 mg total) by mouth once. Patient not taking: Reported on 09/07/2016 08/12/14   Harden Mo, MD  metroNIDAZOLE (FLAGYL) 500 MG tablet Take 1 tablet (500 mg total) by mouth 2 (two) times daily. Patient not taking: Reported on 09/07/2016 08/12/14   Harden Mo, MD  valACYclovir (VALTREX) 1000 MG tablet Take 0.5 tablets (500 mg total) by mouth 2 (two) times daily. Patient not taking: Reported on 09/07/2016 08/12/14   Harden Mo, MD    Family History History reviewed. No pertinent family history.  Social History Social History  Substance Use Topics  . Smoking status: Current Every Day Smoker    Packs/day: 0.50    Types: Cigarettes  . Smokeless tobacco: Never Used  . Alcohol use Yes     Allergies   Patient has no known allergies.   Review of Systems Review of Systems  Constitutional: Negative for fever.  Gastrointestinal: Negative for abdominal pain.  Genitourinary: Positive for vaginal bleeding. Negative for difficulty urinating and dysuria.  All other systems reviewed and are negative.    Physical Exam Updated Vital Signs BP (!) 161/108 (BP Location: Right Arm)   Pulse 60   Temp 98.5 F (36.9 C) (Oral)   Resp 18   Ht 5\' 6"  (1.676 m)   Wt 113.9 kg   LMP  07/19/2016 (Exact Date)   SpO2 100%   BMI 40.51 kg/m   Physical Exam   ED Treatments / Results   DIAGNOSTIC STUDIES: Oxygen Saturation is 100% on RA, normal by my interpretation.    COORDINATION OF CARE: 1:48 AM Discussed treatment plan with pt at bedside which includes labs, OB US imaging and pelvic exam and pt agreed to plan.   Labs (all labs ordered are listed, but only abnormal results are displayed) Labs Reviewed  WET PREP, GENITAL - Abnormal; Notable for the following:       Result Value   WBC, Wet Prep HPF POC MANY (*)    All other components within normal limits  URINALYSIS, ROUTINE W REFLEX MICROSCOPIC -  Abnormal; Notable for the following:    Color, Urine STRAW (*)    Hgb urine dipstick MODERATE (*)    Squamous Epithelial / LPF 0-5 (*)    All other components within normal limits  CBC WITH DIFFERENTIAL/PLATELET - Abnormal; Notable for the following:    WBC 14.1 (*)    Neutro Abs 8.9 (*)    Lymphs Abs 4.1 (*)    All other components within normal limits  HCG, QUANTITATIVE, PREGNANCY - Abnormal; Notable for the following:    hCG, Beta Chain, Quant, S 38,914 (*)    All other components within normal limits  BASIC METABOLIC PANEL - Abnormal; Notable for the following:    Glucose, Bld 109 (*)    Creatinine, Ser 1.01 (*)    Anion gap 4 (*)    All other components within normal limits  HIV ANTIBODY (ROUTINE TESTING)  TYPE AND SCREEN  ABO/RH  GC/CHLAMYDIA PROBE AMP () NOT AT [facility]    EKG  EKG Interpretation None       Radiology US Ob Comp Less 14 Wks  Result Date: 09/06/2016 CLINICAL DATA:  Acute onset of vaginal bleeding.  Initial encounter. EXAM: OBSTETRIC <14 WK [place] AND TRANSVAGINAL OB US TECHNIQUE: Both transabdominal and transvaginal ultrasound examinations were performed for complete evaluation of the gestation as well as the maternal uterus, adnexal regions, and pelvic cul-de-sac. Transvaginal technique was performed to assess early pregnancy. COMPARISON:  None. FINDINGS: Intrauterine gestational sac: Single; visualized and normal in shape. Yolk sac:  Yes Embryo:  Yes Cardiac Activity: Yes Heart Rate: 105  bpm CRL:  6.2  mm   6 w   3 d                  [place] EDC: 04/29/2017 Subchorionic hemorrhage: A small amount of subchorionic hemorrhage is noted. Maternal uterus/adnexae: A 5.7 cm subserosal fibroid is noted at the left side of the uterus. The uterus is otherwise unremarkable. The ovaries are within normal limits. The right ovary measures 2.5 x 2.5 x 2.4 cm, while the left ovary measures 2.7 x 2.2 x 2.7 cm. IMPRESSION: 1. Single live intrauterine pregnancy noted, with a  crown-rump length of 6 mm, corresponding to a gestational age of 108 weeks 3 days. This matches the gestational age of [redacted] weeks 0 days by LMP, reflecting an estimated date of delivery of April 25, 2017. 2. Small amount of subchorionic hemorrhage noted. 3. 5.7 cm subserosal fibroid at the left side of the uterus. Electronically Signed   By: Garald Balding M.D.   On: 09/06/2016 23:28   US Ob Transvaginal  Result Date: 09/06/2016 CLINICAL DATA:  Acute onset of vaginal bleeding.  Initial encounter. EXAM: OBSTETRIC <14 WK [place] AND TRANSVAGINAL OB US TECHNIQUE: Both  transabdominal and transvaginal ultrasound examinations were performed for complete evaluation of the gestation as well as the maternal uterus, adnexal regions, and pelvic cul-de-sac. Transvaginal technique was performed to assess early pregnancy. COMPARISON:  None. FINDINGS: Intrauterine gestational sac: Single; visualized and normal in shape. Yolk sac:  Yes Embryo:  Yes Cardiac Activity: Yes Heart Rate: 105  bpm CRL:  6.2  mm   6 w   3 d                  [place] EDC: 04/29/2017 Subchorionic hemorrhage: A small amount of subchorionic hemorrhage is noted. Maternal uterus/adnexae: A 5.7 cm subserosal fibroid is noted at the left side of the uterus. The uterus is otherwise unremarkable. The ovaries are within normal limits. The right ovary measures 2.5 x 2.5 x 2.4 cm, while the left ovary measures 2.7 x 2.2 x 2.7 cm. IMPRESSION: 1. Single live intrauterine pregnancy noted, with a crown-rump length of 6 mm, corresponding to a gestational age of [redacted] weeks 3 days. This matches the gestational age of [redacted] weeks 0 days by LMP, reflecting an estimated date of delivery of April 25, 2017. 2. Small amount of subchorionic hemorrhage noted. 3. 5.7 cm subserosal fibroid at the left side of the uterus. Electronically Signed   By: Garald Balding M.D.   On: 09/06/2016 23:28    Procedures Procedures (including critical care time)  Medications Ordered in ED Medications - No data to  display   Initial Impression / Assessment and Plan / ED Course  I have reviewed the triage vital signs and the nursing notes.  Pertinent labs & imaging results that were available during my care of the patient were reviewed by me and considered in my medical decision making (see chart for details).  Clinical Course   Patient left before the wet prep was resulted she was informed of her ultrasound results and the importance of having an OB/GYN appointment in the next week due to the vaginal bleeding.     Final Clinical Impressions(s) / ED Diagnoses   Final diagnoses:  Vaginal bleeding in pregnancy    New Prescriptions New Prescriptions   No medications on file    I personally performed the services described in this documentation, which was scribed in my presence. The recorded information has been reviewed and is accurate.     Sylvia Creamer, NP 09/07/16 [id number]    Merryl Hacker, MD 09/07/16 [phone]

## 2016-09-08 LAB — GC/CHLAMYDIA PROBE AMP (~~LOC~~) NOT AT ARMC
Chlamydia: NEGATIVE
Neisseria Gonorrhea: NEGATIVE

## 2016-09-09 NOTE — ED Notes (Signed)
Patient received voice mail saying she had test results-patient requesting results

## 2016-10-10 ENCOUNTER — Inpatient Hospital Stay (HOSPITAL_COMMUNITY)
Admission: AD | Admit: 2016-10-10 | Discharge: 2016-10-10 | Disposition: A | Discharge status: Home / Self Care | Payer: 59 | Source: Ambulatory Visit | Attending: Obstetrics and Gynecology | Admitting: Obstetrics and Gynecology

## 2016-10-10 ENCOUNTER — Encounter (HOSPITAL_COMMUNITY): Payer: Self-pay | Admitting: *Deleted

## 2016-10-10 DIAGNOSIS — D259 Leiomyoma of uterus, unspecified: Secondary | ICD-10-CM | POA: Diagnosis not present

## 2016-10-10 DIAGNOSIS — F17210 Nicotine dependence, cigarettes, uncomplicated: Secondary | ICD-10-CM | POA: Diagnosis not present

## 2016-10-10 DIAGNOSIS — O34211 Maternal care for low transverse scar from previous cesarean delivery: Secondary | ICD-10-CM | POA: Insufficient documentation

## 2016-10-10 DIAGNOSIS — Z3A11 11 weeks gestation of pregnancy: Secondary | ICD-10-CM | POA: Diagnosis not present

## 2016-10-10 DIAGNOSIS — R102 Pelvic and perineal pain: Secondary | ICD-10-CM | POA: Insufficient documentation

## 2016-10-10 DIAGNOSIS — O3411 Maternal care for benign tumor of corpus uteri, first trimester: Secondary | ICD-10-CM | POA: Insufficient documentation

## 2016-10-10 DIAGNOSIS — O10011 Pre-existing essential hypertension complicating pregnancy, first trimester: Secondary | ICD-10-CM | POA: Insufficient documentation

## 2016-10-10 DIAGNOSIS — O99331 Smoking (tobacco) complicating pregnancy, first trimester: Secondary | ICD-10-CM | POA: Diagnosis not present

## 2016-10-10 DIAGNOSIS — O26891 Other specified pregnancy related conditions, first trimester: Secondary | ICD-10-CM

## 2016-10-10 LAB — WET PREP, GENITAL
SPERM: NONE SEEN
Trich, Wet Prep: NONE SEEN
Yeast Wet Prep HPF POC: NONE SEEN

## 2016-10-10 LAB — URINALYSIS, ROUTINE W REFLEX MICROSCOPIC
BILIRUBIN URINE: NEGATIVE
Glucose, UA: NEGATIVE mg/dL
HGB URINE DIPSTICK: NEGATIVE
Ketones, ur: NEGATIVE mg/dL
LEUKOCYTES UA: NEGATIVE
NITRITE: NEGATIVE
PH: 5 (ref 5.0–8.0)
Protein, ur: 30 mg/dL — AB
SPECIFIC GRAVITY, URINE: 1.034 — AB (ref 1.005–1.030)

## 2016-10-10 MED ORDER — PROGESTERONE MICRONIZED 200 MG PO CAPS: 200.0000 mg | ORAL_CAPSULE | Freq: Every day | ORAL | 0 refills | Status: DC

## 2016-10-10 MED ORDER — OXYCODONE-ACETAMINOPHEN 5-325 MG PO TABS
1.0000 | ORAL_TABLET | Freq: Four times a day (QID) | ORAL | Status: DC | PRN
  Administered 2016-10-10: 2 via ORAL
  Filled 2016-10-10: qty 2

## 2016-10-10 MED ORDER — OXYCODONE-ACETAMINOPHEN 5-325 MG PO TABS: 1.0000 | ORAL_TABLET | Freq: Four times a day (QID) | ORAL | 0 refills | Status: DC | PRN

## 2016-10-10 NOTE — MAU Note (Signed)
Thursday had lots of pain woke up Friday with bleeding seen at [facility] ([facility]) office and had u/s that showed no problems with baby, noticed a fibrioid .  Sat relaxed with heating pad, tylenol.   Since Friday night the pain is on left side ovarian pain. Feels like something is ripping from the inside.

## 2016-10-10 NOTE — Discharge Instructions (Signed)
Abdominal Pain During Pregnancy °Belly (abdominal) pain is common during pregnancy. Most of the time, it is not a serious problem. Other times, it can be a sign that something is wrong with the pregnancy. Always tell your doctor if you have belly pain. °Follow these instructions at home: °Monitor your belly pain for any changes. The following actions may help you feel better: °· Do not have sex (intercourse) or put anything in your vagina until you feel better. °· Rest until your pain stops. °· Drink clear fluids if you feel sick to your stomach (nauseous). Do not eat solid food until you feel better. °· Only take medicine as told by your doctor. °· Keep all doctor visits as told. °Get help right away if: °· You are bleeding, leaking fluid, or pieces of tissue come out of your vagina. °· You have more pain or cramping. °· You keep throwing up (vomiting). °· You have pain when you pee (urinate) or have blood in your pee. °· You have a fever. °· You do not feel your baby moving as much. °· You feel very weak or feel like passing out. °· You have trouble breathing, with or without belly pain. °· You have a very bad headache and belly pain. °· You have fluid leaking from your vagina and belly pain. °· You keep having watery poop (diarrhea). °· Your belly pain does not go away after resting, or the pain gets worse. °This information is not intended to replace advice given to you by your health care provider. Make sure you discuss any questions you have with your health care provider. °Document Released: 08/04/2009 Document Revised: 03/24/2016 Document Reviewed: 03/15/2013 °Elsevier Interactive Patient Education © 2017 [employer] ° °

## 2016-10-10 NOTE — MAU Provider Note (Signed)
History     CSN: [id number]  Arrival date and time: 10/10/16 1148   First Provider Initiated Contact with Patient 10/10/16 1224      No chief complaint on file.  [id number] @11 .6 weeks here with pelvic pain. She reports pain started 3 days ago. Rates pain 10/10. Tried Tylenol with no relief. No aggrevating/alleviating factors. She was seen at the office 2 days ago for spotting and similar sx, [place] was normal however shows a 5 cm pedunculated fibroid and another smaller fibroid. Review of records reveals this pregnancy has been complicated by chronic HTN, obesity, and hx of previous CS.     OB History    Gravida Para Term Preterm AB Living   3 2 2    0 2   SAB TAB Ectopic Multiple Live Births           2      Past Medical History:  Diagnosis Date  . Genital herpes   . Hypertension     Past Surgical History:  Procedure Laterality Date  . CESAREAN SECTION     x2    No family history on file.  Social History  Substance Use Topics  . Smoking status: Current Every Day Smoker    Packs/day: 0.50    Types: Cigarettes  . Smokeless tobacco: Never Used  . Alcohol use Yes    Allergies: No Known Allergies  Prescriptions Prior to Admission  Medication Sig Dispense Refill Last Dose  . acetaminophen (TYLENOL) 325 MG tablet Take 650 mg by mouth every 6 (six) hours as needed for mild pain, moderate pain or headache.   10/10/2016 at 0900  . albuterol (PROVENTIL HFA;VENTOLIN HFA) 108 (90 BASE) MCG/ACT inhaler Inhale 2 puffs into the lungs every 4 (four) hours as needed for wheezing or shortness of breath. 1 Inhaler 12 Past Month at Unknown time  . folic acid (FOLVITE) 1 MG tablet Take 1 mg by mouth at bedtime.    10/09/2016 at Unknown time  . labetalol (NORMODYNE) 100 MG tablet Take 100 mg by mouth 2 (two) times daily.   10/10/2016 at 0900    Review of Systems  Constitutional: Negative for chills and fever.  Genitourinary: Positive for pelvic pain. Negative for dysuria, flank pain,  hematuria, urgency, vaginal bleeding and vaginal discharge.   Physical Exam   Blood pressure 138/76, pulse 67, resp. rate 18, height 5\' 7"  (1.702 m), weight 116 kg (255 lb 12.8 oz), last menstrual period 07/19/2016.  Physical Exam  Nursing note and vitals reviewed. Constitutional: She is oriented to person, place, and time. She appears well-developed and well-nourished. No distress (appears comfortable).  HENT:  Head: Normocephalic and atraumatic.  Neck: Normal range of motion.  Cardiovascular: Normal rate.   Respiratory: Effort normal.  GI: Soft. She exhibits no distension and no mass. There is tenderness (LLQ). There is no rebound and no guarding.  Genitourinary:  Genitourinary Comments: Thin, white discharge SVE: closed/long  Musculoskeletal: Normal range of motion.  Neurological: She is alert and oriented to person, place, and time.  Skin: Skin is warm and dry.  Psychiatric: She has a normal mood and affect.   Bedside US: active fetus, FHR 160  Results for orders placed or performed during the hospital encounter of 10/10/16 (from the past 24 hour(s))  Urinalysis, Routine w reflex microscopic     Status: Abnormal   Collection Time: 10/10/16 11:55 AM  Result Value Ref Range   Color, Urine AMBER (A) YELLOW   APPearance HAZY (A) CLEAR  Specific Gravity, Urine 1.034 (H) 1.005 - 1.030   pH 5.0 5.0 - 8.0   Glucose, UA NEGATIVE NEGATIVE mg/dL   Hgb urine dipstick NEGATIVE NEGATIVE   Bilirubin Urine NEGATIVE NEGATIVE   Ketones, ur NEGATIVE NEGATIVE mg/dL   Protein, ur 30 (A) NEGATIVE mg/dL   Nitrite NEGATIVE NEGATIVE   Leukocytes, UA NEGATIVE NEGATIVE   RBC / HPF 0-5 0 - 5 RBC/hpf   WBC, UA 0-5 0 - 5 WBC/hpf   Bacteria, UA RARE (A) NONE SEEN   Squamous Epithelial / LPF 6-30 (A) NONE SEEN   Mucous PRESENT   Wet prep, genital     Status: Abnormal   Collection Time: 10/10/16 12:34 PM  Result Value Ref Range   Yeast Wet Prep HPF POC NONE SEEN NONE SEEN   Trich, Wet Prep NONE  SEEN NONE SEEN   Clue Cells Wet Prep HPF POC PRESENT (A) NONE SEEN   WBC, Wet Prep HPF POC FEW (A) NONE SEEN   Sperm NONE SEEN    MAU Course  Procedures Percocet 2 tabs x1 dose  MDM Labs ordered and reviewed. No evidence of UTI or pelvic infection. Report pain reduced after Percocet. Pain likely caused by degenerating fibroids. Presentation, clinical findings, and plan discussed with Dr. Melba Coon, recommends Prometrium 200 mg pv at hs. Stable for discharge home.  Assessment and Plan   1. [redacted] weeks gestation of pregnancy   2. Pelvic pain affecting pregnancy in first trimester, antepartum   3. Leiomyoma of uterus affecting pregnancy in first trimester    Discharge home Follow up at [facility] in 2 days as scheduled Notify MD of worsening sx  Allergies as of 10/10/2016   No Known Allergies     Medication List    STOP taking these medications   acetaminophen 325 MG tablet Commonly known as:  TYLENOL     TAKE these medications   albuterol 108 (90 Base) MCG/ACT inhaler Commonly known as:  PROVENTIL HFA;VENTOLIN HFA Inhale 2 puffs into the lungs every 4 (four) hours as needed for wheezing or shortness of breath.   folic acid 1 MG tablet Commonly known as:  FOLVITE Take 1 mg by mouth at bedtime.   labetalol 100 MG tablet Commonly known as:  NORMODYNE Take 100 mg by mouth 2 (two) times daily.   oxyCODONE-acetaminophen 5-325 MG tablet Commonly known as:  PERCOCET/ROXICET Take 1-2 tablets by mouth every 6 (six) hours as needed for severe pain.   progesterone 200 MG capsule Commonly known as:  PROMETRIUM Place 1 capsule (200 mg total) vaginally at bedtime.      Julianne Handler, CNM 10/10/2016, 12:34 PM

## 2016-11-09 ENCOUNTER — Encounter (HOSPITAL_COMMUNITY): Payer: Self-pay | Admitting: Emergency Medicine

## 2016-11-09 ENCOUNTER — Emergency Department (HOSPITAL_COMMUNITY)
Admission: EM | Admit: 2016-11-09 | Discharge: 2016-11-09 | Disposition: A | Discharge status: Home / Self Care | Payer: 59 | Attending: Emergency Medicine | Admitting: Emergency Medicine

## 2016-11-09 DIAGNOSIS — J4521 Mild intermittent asthma with (acute) exacerbation: Secondary | ICD-10-CM

## 2016-11-09 DIAGNOSIS — I10 Essential (primary) hypertension: Secondary | ICD-10-CM | POA: Insufficient documentation

## 2016-11-09 DIAGNOSIS — F17210 Nicotine dependence, cigarettes, uncomplicated: Secondary | ICD-10-CM | POA: Diagnosis not present

## 2016-11-09 DIAGNOSIS — R0602 Shortness of breath: Secondary | ICD-10-CM | POA: Diagnosis present

## 2016-11-09 DIAGNOSIS — Z79899 Other long term (current) drug therapy: Secondary | ICD-10-CM | POA: Insufficient documentation

## 2016-11-09 MED ORDER — DEXAMETHASONE 4 MG PO TABS
10.0000 mg | ORAL_TABLET | Freq: Once | ORAL | Status: AC
  Administered 2016-11-09: 10 mg via ORAL
  Filled 2016-11-09: qty 3

## 2016-11-09 MED ORDER — ALBUTEROL SULFATE HFA 108 (90 BASE) MCG/ACT IN AERS
8.0000 | INHALATION_SPRAY | Freq: Once | RESPIRATORY_TRACT | Status: AC
  Administered 2016-11-09: 8 via RESPIRATORY_TRACT
  Filled 2016-11-09: qty 6.7

## 2016-11-09 NOTE — ED Triage Notes (Signed)
Pt here with c/o sob after running up steps at work , pt received 1 dose of albuterol , pt is c/o dizziness now along with some chest tightness , pt is [redacted] weeks pregnant

## 2016-11-09 NOTE — ED Provider Notes (Signed)
[facility] DEPT Provider Note   CSN: [id number] Arrival date & time: 11/09/16  1031     History   Chief Complaint Chief Complaint  Patient presents with  . Shortness of Breath    HPI Sylvia Baxter is a 36 y.o. female.  36 yo F with a chief complaints of shortness of breath. Patient ran up a flight of stairs to try make it to work on time. When she got there she was very short of breath and felt a little lightheaded. She was given some pus albuterol by EMS with some improvement of her shortness of breath and felt a little dizzy afterwards. Deny chest pain and denied syncope denies lower extremity edema.   The history is provided by the patient.  Shortness of Breath  This is a new problem. The average episode lasts 20 minutes. The problem occurs frequently.The current episode started less than 1 hour ago. The problem has been resolved. Pertinent negatives include no fever, no headaches, no rhinorrhea, no wheezing, no chest pain and no vomiting. She has tried nothing for the symptoms. The treatment provided no relief. Associated medical issues include asthma.    Past Medical History:  Diagnosis Date  . Genital herpes   . Hypertension     There are no active problems to display for this patient.   Past Surgical History:  Procedure Laterality Date  . CESAREAN SECTION     x2    OB History    Gravida Para Term Preterm AB Living   3 2 2    0 2   SAB TAB Ectopic Multiple Live Births           2       Home Medications    Prior to Admission medications   Medication Sig Start Date End Date Taking? Authorizing Provider  albuterol (PROVENTIL HFA;VENTOLIN HFA) 108 (90 BASE) MCG/ACT inhaler Inhale 2 puffs into the lungs every 4 (four) hours as needed for wheezing or shortness of breath. 08/12/14   Harden Mo, MD  folic acid (FOLVITE) 1 MG tablet Take 1 mg by mouth at bedtime.     Historical Provider, MD  labetalol (NORMODYNE) 100 MG tablet Take 100 mg by mouth 2  (two) times daily.    Historical Provider, MD  oxyCODONE-acetaminophen (PERCOCET/ROXICET) 5-325 MG tablet Take 1-2 tablets by mouth every 6 (six) hours as needed for severe pain. 10/10/16   Julianne Handler, CNM  progesterone (PROMETRIUM) 200 MG capsule Place 1 capsule (200 mg total) vaginally at bedtime. 10/10/16 10/10/17  Julianne Handler, CNM    Family History History reviewed. No pertinent family history.  Social History Social History  Substance Use Topics  . Smoking status: Current Every Day Smoker    Packs/day: 0.50    Types: Cigarettes  . Smokeless tobacco: Never Used  . Alcohol use Yes     Allergies   Patient has no known allergies.   Review of Systems Review of Systems  Constitutional: Negative for chills and fever.  HENT: Negative for congestion and rhinorrhea.   Eyes: Negative for redness and visual disturbance.  Respiratory: Positive for shortness of breath. Negative for wheezing.   Cardiovascular: Negative for chest pain and palpitations.  Gastrointestinal: Negative for nausea and vomiting.  Genitourinary: Negative for dysuria and urgency.  Musculoskeletal: Negative for arthralgias and myalgias.  Skin: Negative for pallor and wound.  Neurological: Negative for dizziness and headaches.     Physical Exam Updated Vital Signs BP 137/85 (BP Location: Right Arm)  Pulse 110   Temp 97.6 F (36.4 C) (Oral)   Resp 20   LMP 07/19/2016 (Exact Date)   SpO2 100%   Physical Exam  Constitutional: She is oriented to person, place, and time. She appears well-developed and well-nourished. No distress.  HENT:  Head: Normocephalic and atraumatic.  Eyes: EOM are normal. Pupils are equal, round, and reactive to light.  Neck: Normal range of motion. Neck supple.  Cardiovascular: Normal rate and regular rhythm.  Exam reveals no gallop and no friction rub.   No murmur heard. Pulmonary/Chest: Effort normal. She has no wheezes. She has no rales.  Prolonged expiration    Abdominal: Soft. She exhibits no distension and no mass. There is no tenderness. There is no guarding.  Musculoskeletal: She exhibits no edema or tenderness.  Neurological: She is alert and oriented to person, place, and time.  Skin: Skin is warm and dry. She is not diaphoretic.  Psychiatric: She has a normal mood and affect. Her behavior is normal.  Nursing note and vitals reviewed.    ED Treatments / Results  Labs (all labs ordered are listed, but only abnormal results are displayed) Labs Reviewed - No data to display  EKG  EKG Interpretation  Date/Time:  Tuesday November 09 2016 10:37:27 EDT Ventricular Rate:  70 PR Interval:    QRS Duration: 87 QT Interval:  414 QTC Calculation: 447 R Axis:   64 Text Interpretation:  Sinus rhythm No old tracing to compare Confirmed by Areona Homer MD, DANIEL ([id number]) on 11/09/2016 10:42:28 AM       Radiology No results found.  Procedures Procedures (including critical care time)  Medications Ordered in ED Medications  dexamethasone (DECADRON) tablet 10 mg (not administered)  albuterol (PROVENTIL HFA;VENTOLIN HFA) 108 (90 Base) MCG/ACT inhaler 8 puff (not administered)     Initial Impression / Assessment and Plan / ED Course  I have reviewed the triage vital signs and the nursing notes.  Pertinent labs & imaging results that were available during my care of the patient were reviewed by me and considered in my medical decision making (see chart for details).     36 yo F With a chief complaint of shortness of breath. This may be a relation to her recent pregnancy or could be her asthma. Her lung sounds with no wheezes but some mild prolonged expiration. She had a bronchospastic cough as well. We'll give her a puffs albuterol and Decadron. I doubt PE discussed the risk and benefit ratio of performing imaging. She'll follow with her family doctor.  11:06 AM:  I have discussed the diagnosis/risks/treatment options with the patient and family  and believe the pt to be eligible for discharge home to follow-up with PCP. We also discussed returning to the ED immediately if new or worsening sx occur. We discussed the sx which are most concerning (e.g., sudden worsening pain, fever, inability to tolerate by mouth) that necessitate immediate return. Medications administered to the patient during their visit and any new prescriptions provided to the patient are listed below.  Medications given during this visit Medications  dexamethasone (DECADRON) tablet 10 mg (not administered)  albuterol (PROVENTIL HFA;VENTOLIN HFA) 108 (90 Base) MCG/ACT inhaler 8 puff (not administered)     The patient appears reasonably screen and/or stabilized for discharge and I doubt any other medical condition or other [facility] requiring further screening, evaluation, or treatment in the ED at this time prior to discharge.    Final Clinical Impressions(s) / ED Diagnoses   Final  diagnoses:  Mild intermittent asthma with exacerbation    New Prescriptions New Prescriptions   No medications on file     Deno Etienne, DO 11/09/16 1106

## 2016-11-30 ENCOUNTER — Other Ambulatory Visit (HOSPITAL_COMMUNITY): Payer: Self-pay | Admitting: Obstetrics & Gynecology

## 2016-11-30 DIAGNOSIS — O280 Abnormal hematological finding on antenatal screening of mother: Secondary | ICD-10-CM

## 2016-11-30 DIAGNOSIS — Z3689 Encounter for other specified antenatal screening: Secondary | ICD-10-CM

## 2016-11-30 DIAGNOSIS — Z3A20 20 weeks gestation of pregnancy: Secondary | ICD-10-CM

## 2016-12-07 ENCOUNTER — Other Ambulatory Visit (HOSPITAL_COMMUNITY): Payer: Self-pay | Admitting: Obstetrics & Gynecology

## 2016-12-07 ENCOUNTER — Ambulatory Visit (HOSPITAL_COMMUNITY)
Admission: RE | Admit: 2016-12-07 | Discharge: 2016-12-07 | Disposition: A | Discharge status: Home / Self Care | Payer: 59 | Source: Ambulatory Visit | Attending: Obstetrics & Gynecology | Admitting: Obstetrics & Gynecology

## 2016-12-07 ENCOUNTER — Encounter (HOSPITAL_COMMUNITY): Payer: Self-pay

## 2016-12-07 ENCOUNTER — Other Ambulatory Visit (HOSPITAL_COMMUNITY): Payer: Self-pay | Admitting: *Deleted

## 2016-12-07 DIAGNOSIS — O10012 Pre-existing essential hypertension complicating pregnancy, second trimester: Secondary | ICD-10-CM | POA: Insufficient documentation

## 2016-12-07 DIAGNOSIS — Z3A20 20 weeks gestation of pregnancy: Secondary | ICD-10-CM

## 2016-12-07 DIAGNOSIS — O282 Abnormal cytological finding on antenatal screening of mother: Secondary | ICD-10-CM | POA: Insufficient documentation

## 2016-12-07 DIAGNOSIS — O3412 Maternal care for benign tumor of corpus uteri, second trimester: Secondary | ICD-10-CM | POA: Diagnosis present

## 2016-12-07 DIAGNOSIS — D259 Leiomyoma of uterus, unspecified: Secondary | ICD-10-CM

## 2016-12-07 DIAGNOSIS — O99212 Obesity complicating pregnancy, second trimester: Secondary | ICD-10-CM

## 2016-12-07 DIAGNOSIS — O09522 Supervision of elderly multigravida, second trimester: Secondary | ICD-10-CM | POA: Insufficient documentation

## 2016-12-07 DIAGNOSIS — IMO0002 Reserved for concepts with insufficient information to code with codable children: Secondary | ICD-10-CM

## 2016-12-07 DIAGNOSIS — Z3689 Encounter for other specified antenatal screening: Secondary | ICD-10-CM

## 2016-12-07 DIAGNOSIS — O162 Unspecified maternal hypertension, second trimester: Secondary | ICD-10-CM

## 2016-12-07 DIAGNOSIS — O280 Abnormal hematological finding on antenatal screening of mother: Secondary | ICD-10-CM

## 2016-12-07 DIAGNOSIS — Z0489 Encounter for examination and observation for other specified reasons: Secondary | ICD-10-CM

## 2016-12-07 NOTE — Progress Notes (Addendum)
Genetic Counseling  High-Risk Gestation Note  Appointment Date:  12/07/2016 Referred By: Janyth Pupa, DO Date of Birth:  Apr 04, 1981  Pregnancy History: [id number] Estimated Date of Delivery: 04/25/17 Estimated Gestational Age: [redacted]w[redacted]d Attending: Griffin Dakin, MD   Ms. Sylvia Baxter was seen for genetic counseling regarding a maternal age of 66 and an increased risk for Down syndrome based on Quad screening results.  In summary:  Discussed AMA and associated risk for fetal aneuploidy  Reviewed results of screening-inaccurate dating used for the screen (listed as 17.4 weeks, but by LMP and early ultrasound should be 18.4 weeks on the date of screening)  Recalculation requested, results pending  Discussed options for additional screening (given that patient is AMA)  NIPS-declined  Ultrasound-performed today; no anomalies or markers for aneuploidy detected; limited views obtained  Discussed diagnostic testing options  Amniocentesis-declined  Reviewed family history concerns  Discussed carrier screening options  CF, SMA, and hemoglobinopathies-declined  She was counseled regarding maternal age and the association with risk for chromosome conditions due to nondisjunction with aging of the ova. We reviewed chromosomes, nondisjunction, and the associated 1 in 164 risk for fetal aneuploidy related to a maternal age of 74 at [redacted]w[redacted]d gestation.  She was counseled that the risk for aneuploidy decreases as gestational age increases, accounting for those pregnancies which spontaneously abort. We specifically discussed Down syndrome (trisomy 55), trisomies 31 and 57, and sex chromosome aneuploidies (47,XXX and 47,XXY) including the common features and prognoses of each.   We also reviewed Sylvia Baxter's maternal serum Quad screen result and the associated increase in risk for fetal Down syndrome (1 in 298 to 1 in 188).  However, upon review of the Quad screen result, it appears that the  patient's gestational age was entered incorrectly as 17.4 weeks on the date of screening. By both LMP and early ultrasound, the patient's gestational age should have been [redacted]w[redacted]d on the date of screening. The referring provider's office was notified of the dating discrepancy and asked to contact the laboratory for recalculation. The results were pending at the time of the appointment today.   Sylvia Baxter understands that the screen adjusted risk for Down syndrome will likely change with recalculation. However, given her advanced maternal age, we reviewed additional options for screening including noninvasive prenatal screening and detailed ultrasound. She was counseled that screening tests are used to modify a patient's a priori risk for aneuploidy, typically based on age. This estimate provides a pregnancy specific risk assessment. We reviewed the benefits and limitations of each option. Specifically, we discussed the conditions for which each test screens, the detection rates, and false positive rates of each. She was also counseled regarding diagnostic testing via amniocentesis. We reviewed the approximate 1 in 242-683 risk for complications from amniocentesis, including spontaneous pregnancy loss. We discussed the possible results that the tests might provide including: positive, negative, unanticipated, and no result. Finally, she was counseled regarding the cost of each option and potential out of pocket expenses. After consideration of all the options, she elected to have a detailed anatomy ultrasound, which was performed today. The report will be documented separately. The views were sub-optimal; however, there were no anomalies or markers for aneuploidy detected. A follow-up ultrasound was scheduled. The patient declined both NIPS and amniocentesis today.  Once available, we will review her recalculated Quad screen result. She will decide at that time whether or not she wishes to have NIPS.  She expressed that  she does not want amniocentesis regardless of  the recalculated Quad screen results.  Sylvia Baxter was provided with written information regarding cystic fibrosis (CF), spinal muscular atrophy (SMA) and hemoglobinopathies including the carrier frequency, availability of carrier screening and prenatal diagnosis if indicated.  In addition, we discussed that CF and hemoglobinopathies are routinely screened for as part of the [place] newborn screening panel.  After further discussion, she declined screening for CF, SMA and hemoglobinopathies.  Both family histories were reviewed and found to be contributory for the patient's paternal half-brother having intellectual disability of unknown etiology. She was counseled that there are many different causes of intellectual disabilities including environmental, multifactorial, and genetic etiologies.  We discussed that a specific diagnosis for intellectual disability can be determined in approximately 50% of these individuals.  In the remaining 50% of individuals, a diagnosis may never be determined.  Regarding genetic causes, we discussed that chromosome aberrations (aneuploidy, deletions, duplications, insertions, and translocations) are responsible for a small percentage of individuals with intellectual disability.  Many individuals with chromosome aberrations have additional differences, including congenital anomalies or minor dysmorphisms.  Likewise, single gene conditions are the underlying cause of intellectual delay in some families.  We discussed that many gene conditions have intellectual disability as a feature, but also often include other physical or medical differences.  Specifically, we reviewed fragile X syndrome and the X-linked inheritance of this condition.  In addition, we discussed the option of this family having an evaluation by a medical geneticist to help determine the cause of the familial intellectual disability.  Sylvia Baxter will notify me if her brother is  interested in a referral to medical genetics.  We discussed that without more specific information, it is difficult to provide an accurate risk assessment.  Further genetic counseling is warranted if more information is obtained.  The remainder of both family histories were noncontributory for birth defects, intellectual disability, and known genetic conditions. Without further information regarding the provided family history, an accurate genetic risk cannot be calculated. Further genetic counseling is warranted if more information is obtained.  Sylvia Baxter denied exposure to environmental toxins or chemical agents. She denied the use of alcohol and street drugs. She reported that she smokes <1/2 ppd. We discussed the implications and counseled regarding cessation. She denied significant viral illnesses during the course of her pregnancy. Her medical and surgical histories were contributory for [facility]. She currently takes labetalol.  I counseled this patient regarding the above risks and available options.  The approximate face-to-face time with the genetic counselor was 42 minutes.   Filbert Schilder, MS Certified Genetic Counselor

## 2016-12-13 ENCOUNTER — Telehealth (HOSPITAL_COMMUNITY): Payer: Self-pay

## 2016-12-13 NOTE — Telephone Encounter (Signed)
Called Sylvia Baxter and reviewed that the recalculated Quad screen result using the correct EDC showed an adjusted risk for Down syndrome of 1 in 95, which is slightly greater than the prior screening result of 1 in 188. We reviewed the options of further screening with NIPS (Panorama) and diagnostic testing via amniocentesis. Alternatively, we discussed that no further testing is necessary if she feels comfortable with her prior screening/ultrasound results. After thoughtful consideration of her options, Sylvia Baxter elected to have Panorama screening. Given that Sylvia Baxter recently transferred her care to [facility] and has a NOB on Wednesday 12/15/16, we discussed the option of having Panorama drawn through Dr. Gregor Hams office versus at the [facility]. She wishes to come to the [facility] on Wednesday 12/15/16 (2:30 pm) prior to her NOB for this testing.  All questions answered and concerns addressed.   Filbert Schilder, MS  Certified Genetic Counselor

## 2016-12-15 ENCOUNTER — Ambulatory Visit (HOSPITAL_COMMUNITY)
Admission: RE | Admit: 2016-12-15 | Discharge: 2016-12-15 | Disposition: A | Discharge status: Home / Self Care | Payer: 59 | Source: Ambulatory Visit | Attending: Obstetrics & Gynecology | Admitting: Obstetrics & Gynecology

## 2016-12-15 DIAGNOSIS — Z029 Encounter for administrative examinations, unspecified: Secondary | ICD-10-CM | POA: Diagnosis present

## 2016-12-25 ENCOUNTER — Other Ambulatory Visit: Payer: Self-pay

## 2016-12-27 ENCOUNTER — Telehealth (HOSPITAL_COMMUNITY): Payer: Self-pay

## 2016-12-27 NOTE — Telephone Encounter (Signed)
Left patient a VM regarding her NIPS (Panorama) results. Encouraged patient to return my call to review results. Filbert Schilder, MS CGC

## 2016-12-28 ENCOUNTER — Telehealth (HOSPITAL_COMMUNITY): Payer: Self-pay | Admitting: *Deleted

## 2016-12-28 ENCOUNTER — Telehealth (HOSPITAL_COMMUNITY): Payer: Self-pay

## 2016-12-28 NOTE — Telephone Encounter (Signed)
Called Sylvia Baxter to discuss her prenatal cell free DNA test results.  Ms. Sylvia Baxter had Panorama testing through [facility].  Testing was offered because of a maternal age of 64.  The patient was identified by name and DOB.  We reviewed that these are within normal limits, showing a less than 1 in 10,000 risk for trisomies 21, 18 and 13, and monosomy X (Turner syndrome).  In addition, the risk for triploidy and sex chromosome trisomies (47,XXX and 47,XXY) was also low risk.  We reviewed that this testing identifies > 99% of pregnancies with trisomy 76, trisomy 3, sex chromosome trisomies (47,XXX and 47,XXY), and triploidy. The detection rate for trisomy 18 is 96%.  The detection rate for monosomy X is ~92%.  The false positive rate is <0.1% for all conditions. Testing was also consistent with female fetal sex.  The patient did wish to know fetal sex. She understands that this testing does not identify all genetic conditions.  All questions were answered to her satisfaction, she was encouraged to call with additional questions or concerns.  Sylvia Richters, MS Certified Genetic Counselor

## 2016-12-28 NOTE — Telephone Encounter (Signed)
Pt had called the genetic counselor requesting FMLA paperwork to be filled out for her last MFM visit.  RN called pt, LM stating that we do not complete FMLA paperwork.  She would need to contact her primary OB office.  We can give her a note stating that she was in the office for her last visit.  Left MFM office number for pt to call back if she had any questions.

## 2016-12-31 ENCOUNTER — Other Ambulatory Visit: Payer: Self-pay

## 2017-01-06 ENCOUNTER — Ambulatory Visit (HOSPITAL_COMMUNITY)
Admission: RE | Admit: 2017-01-06 | Discharge: 2017-01-06 | Disposition: A | Discharge status: Home / Self Care | Payer: 59 | Source: Ambulatory Visit | Attending: Obstetrics & Gynecology | Admitting: Obstetrics & Gynecology

## 2017-01-06 ENCOUNTER — Other Ambulatory Visit (HOSPITAL_COMMUNITY): Payer: Self-pay | Admitting: Obstetrics and Gynecology

## 2017-01-06 ENCOUNTER — Encounter (HOSPITAL_COMMUNITY): Payer: Self-pay

## 2017-01-06 VITALS — BP 132/77 | HR 75 | Wt 261.6 lb

## 2017-01-06 DIAGNOSIS — IMO0002 Reserved for concepts with insufficient information to code with codable children: Secondary | ICD-10-CM

## 2017-01-06 DIAGNOSIS — Z0489 Encounter for examination and observation for other specified reasons: Secondary | ICD-10-CM

## 2017-01-06 DIAGNOSIS — Z3A24 24 weeks gestation of pregnancy: Secondary | ICD-10-CM | POA: Diagnosis not present

## 2017-01-06 DIAGNOSIS — O10919 Unspecified pre-existing hypertension complicating pregnancy, unspecified trimester: Secondary | ICD-10-CM | POA: Diagnosis present

## 2017-01-06 DIAGNOSIS — O162 Unspecified maternal hypertension, second trimester: Secondary | ICD-10-CM | POA: Insufficient documentation

## 2017-02-02 ENCOUNTER — Encounter: Payer: 59 | Attending: Obstetrics and Gynecology | Admitting: Registered"

## 2017-02-02 DIAGNOSIS — O169 Unspecified maternal hypertension, unspecified trimester: Secondary | ICD-10-CM | POA: Insufficient documentation

## 2017-02-02 DIAGNOSIS — O99810 Abnormal glucose complicating pregnancy: Secondary | ICD-10-CM | POA: Diagnosis present

## 2017-02-02 DIAGNOSIS — Z713 Dietary counseling and surveillance: Secondary | ICD-10-CM | POA: Diagnosis not present

## 2017-02-02 DIAGNOSIS — R7309 Other abnormal glucose: Secondary | ICD-10-CM

## 2017-02-02 DIAGNOSIS — Z3A00 Weeks of gestation of pregnancy not specified: Secondary | ICD-10-CM | POA: Insufficient documentation

## 2017-02-02 NOTE — Progress Notes (Signed)
Patient was seen on 02/02/17 for Gestational Diabetes self-management class at the [facility]. The following learning objectives were met by the patient during this course:   States the definition of Gestational Diabetes  States why dietary management is important in controlling blood glucose  Describes the effects each nutrient has on blood glucose levels  Demonstrates ability to create a balanced meal plan  Demonstrates carbohydrate counting   States when to check blood glucose levels  Demonstrates proper blood glucose monitoring techniques  States the effect of stress and exercise on blood glucose levels  States the importance of limiting caffeine and abstaining from alcohol and smoking  Blood glucose monitor given: Accu-Chek Guide Lot # [id number] Exp: 11/17/17 Blood glucose reading: 103  Patient instructed to monitor glucose levels: FBS: 60 - <90 1 hour: <140 2 hour: <120  Patient received handouts:  Nutrition Diabetes and Pregnancy  Carbohydrate Counting List  Patient will be seen for follow-up as needed.

## 2017-02-03 ENCOUNTER — Ambulatory Visit (HOSPITAL_COMMUNITY)
Admission: RE | Admit: 2017-02-03 | Discharge: 2017-02-03 | Disposition: A | Discharge status: Home / Self Care | Payer: 59 | Source: Ambulatory Visit | Attending: Obstetrics and Gynecology | Admitting: Obstetrics and Gynecology

## 2017-02-03 ENCOUNTER — Encounter (HOSPITAL_COMMUNITY): Payer: Self-pay

## 2017-02-03 ENCOUNTER — Other Ambulatory Visit (HOSPITAL_COMMUNITY): Payer: Self-pay | Admitting: Maternal & Fetal Medicine

## 2017-02-03 DIAGNOSIS — O24410 Gestational diabetes mellitus in pregnancy, diet controlled: Secondary | ICD-10-CM | POA: Diagnosis not present

## 2017-02-03 DIAGNOSIS — Z3A28 28 weeks gestation of pregnancy: Secondary | ICD-10-CM

## 2017-02-03 DIAGNOSIS — O99213 Obesity complicating pregnancy, third trimester: Secondary | ICD-10-CM

## 2017-02-03 DIAGNOSIS — O10013 Pre-existing essential hypertension complicating pregnancy, third trimester: Secondary | ICD-10-CM | POA: Diagnosis not present

## 2017-02-03 DIAGNOSIS — O10919 Unspecified pre-existing hypertension complicating pregnancy, unspecified trimester: Secondary | ICD-10-CM

## 2017-02-03 DIAGNOSIS — O3413 Maternal care for benign tumor of corpus uteri, third trimester: Secondary | ICD-10-CM | POA: Diagnosis not present

## 2017-02-03 DIAGNOSIS — D259 Leiomyoma of uterus, unspecified: Secondary | ICD-10-CM | POA: Insufficient documentation

## 2017-02-03 DIAGNOSIS — O289 Unspecified abnormal findings on antenatal screening of mother: Secondary | ICD-10-CM | POA: Insufficient documentation

## 2017-02-03 DIAGNOSIS — O09523 Supervision of elderly multigravida, third trimester: Secondary | ICD-10-CM

## 2017-02-03 DIAGNOSIS — O10913 Unspecified pre-existing hypertension complicating pregnancy, third trimester: Secondary | ICD-10-CM | POA: Diagnosis present

## 2017-02-03 HISTORY — DX: Gestational diabetes mellitus in pregnancy, unspecified control: O24.419

## 2017-02-04 ENCOUNTER — Encounter: Payer: Self-pay | Admitting: Registered"

## 2017-03-03 ENCOUNTER — Other Ambulatory Visit (HOSPITAL_COMMUNITY): Payer: Self-pay | Admitting: Obstetrics and Gynecology

## 2017-03-03 ENCOUNTER — Encounter (HOSPITAL_COMMUNITY): Payer: Self-pay

## 2017-03-03 ENCOUNTER — Other Ambulatory Visit (HOSPITAL_COMMUNITY): Payer: Self-pay | Admitting: Maternal & Fetal Medicine

## 2017-03-03 ENCOUNTER — Ambulatory Visit (HOSPITAL_COMMUNITY)
Admission: RE | Admit: 2017-03-03 | Discharge: 2017-03-03 | Disposition: A | Discharge status: Home / Self Care | Payer: Medicaid Other | Source: Ambulatory Visit | Attending: Obstetrics and Gynecology | Admitting: Obstetrics and Gynecology

## 2017-03-03 DIAGNOSIS — O288 Other abnormal findings on antenatal screening of mother: Secondary | ICD-10-CM | POA: Diagnosis not present

## 2017-03-03 DIAGNOSIS — Z3A32 32 weeks gestation of pregnancy: Secondary | ICD-10-CM

## 2017-03-03 DIAGNOSIS — Z6841 Body Mass Index (BMI) 40.0 and over, adult: Secondary | ICD-10-CM | POA: Insufficient documentation

## 2017-03-03 DIAGNOSIS — E669 Obesity, unspecified: Secondary | ICD-10-CM | POA: Diagnosis not present

## 2017-03-03 DIAGNOSIS — Z79899 Other long term (current) drug therapy: Secondary | ICD-10-CM | POA: Diagnosis not present

## 2017-03-03 DIAGNOSIS — O3413 Maternal care for benign tumor of corpus uteri, third trimester: Secondary | ICD-10-CM

## 2017-03-03 DIAGNOSIS — D259 Leiomyoma of uterus, unspecified: Secondary | ICD-10-CM | POA: Insufficient documentation

## 2017-03-03 DIAGNOSIS — O10919 Unspecified pre-existing hypertension complicating pregnancy, unspecified trimester: Secondary | ICD-10-CM

## 2017-03-03 DIAGNOSIS — O24410 Gestational diabetes mellitus in pregnancy, diet controlled: Secondary | ICD-10-CM

## 2017-03-03 DIAGNOSIS — O10913 Unspecified pre-existing hypertension complicating pregnancy, third trimester: Secondary | ICD-10-CM | POA: Insufficient documentation

## 2017-03-03 DIAGNOSIS — O09523 Supervision of elderly multigravida, third trimester: Secondary | ICD-10-CM | POA: Diagnosis not present

## 2017-03-03 DIAGNOSIS — O99213 Obesity complicating pregnancy, third trimester: Secondary | ICD-10-CM | POA: Insufficient documentation

## 2017-03-24 ENCOUNTER — Emergency Department (HOSPITAL_COMMUNITY)
Admission: EM | Admit: 2017-03-24 | Discharge: 2017-03-25 | Disposition: A | Discharge status: Home / Self Care | Payer: 59 | Attending: Emergency Medicine | Admitting: Emergency Medicine

## 2017-03-24 ENCOUNTER — Encounter (HOSPITAL_COMMUNITY): Payer: Self-pay | Admitting: Emergency Medicine

## 2017-03-24 DIAGNOSIS — F1290 Cannabis use, unspecified, uncomplicated: Secondary | ICD-10-CM | POA: Diagnosis not present

## 2017-03-24 DIAGNOSIS — O24414 Gestational diabetes mellitus in pregnancy, insulin controlled: Secondary | ICD-10-CM | POA: Diagnosis not present

## 2017-03-24 DIAGNOSIS — Z3A35 35 weeks gestation of pregnancy: Secondary | ICD-10-CM | POA: Insufficient documentation

## 2017-03-24 DIAGNOSIS — F329 Major depressive disorder, single episode, unspecified: Secondary | ICD-10-CM | POA: Diagnosis not present

## 2017-03-24 DIAGNOSIS — O10013 Pre-existing essential hypertension complicating pregnancy, third trimester: Secondary | ICD-10-CM | POA: Insufficient documentation

## 2017-03-24 DIAGNOSIS — R45851 Suicidal ideations: Secondary | ICD-10-CM | POA: Insufficient documentation

## 2017-03-24 DIAGNOSIS — Z87891 Personal history of nicotine dependence: Secondary | ICD-10-CM | POA: Insufficient documentation

## 2017-03-24 DIAGNOSIS — O99323 Drug use complicating pregnancy, third trimester: Secondary | ICD-10-CM | POA: Insufficient documentation

## 2017-03-24 DIAGNOSIS — Z79899 Other long term (current) drug therapy: Secondary | ICD-10-CM | POA: Insufficient documentation

## 2017-03-24 DIAGNOSIS — O99343 Other mental disorders complicating pregnancy, third trimester: Secondary | ICD-10-CM | POA: Insufficient documentation

## 2017-03-24 DIAGNOSIS — F1210 Cannabis abuse, uncomplicated: Secondary | ICD-10-CM | POA: Insufficient documentation

## 2017-03-24 DIAGNOSIS — F4321 Adjustment disorder with depressed mood: Secondary | ICD-10-CM | POA: Diagnosis not present

## 2017-03-24 LAB — ETHANOL

## 2017-03-24 LAB — BASIC METABOLIC PANEL
ANION GAP: 8 (ref 5–15)
BUN: 9 mg/dL (ref 6–20)
CALCIUM: 8.5 mg/dL — AB (ref 8.9–10.3)
CO2: 20 mmol/L — ABNORMAL LOW (ref 22–32)
CREATININE: 0.9 mg/dL (ref 0.44–1.00)
Chloride: 109 mmol/L (ref 101–111)
Glucose, Bld: 71 mg/dL (ref 65–99)
Potassium: 3.9 mmol/L (ref 3.5–5.1)
SODIUM: 137 mmol/L (ref 135–145)

## 2017-03-24 LAB — CBC WITH DIFFERENTIAL/PLATELET
BASOS ABS: 0 10*3/uL (ref 0.0–0.1)
BASOS PCT: 0 %
EOS ABS: 0 10*3/uL (ref 0.0–0.7)
Eosinophils Relative: 1 %
HCT: 32.3 % — ABNORMAL LOW (ref 36.0–46.0)
HEMOGLOBIN: 10.6 g/dL — AB (ref 12.0–15.0)
Lymphocytes Relative: 27 %
Lymphs Abs: 2.1 10*3/uL (ref 0.7–4.0)
MCH: 28 pg (ref 26.0–34.0)
MCHC: 32.8 g/dL (ref 30.0–36.0)
MCV: 85.4 fL (ref 78.0–100.0)
Monocytes Absolute: 0.5 10*3/uL (ref 0.1–1.0)
Monocytes Relative: 7 %
NEUTROS PCT: 65 %
Neutro Abs: 5 10*3/uL (ref 1.7–7.7)
Platelets: 290 10*3/uL (ref 150–400)
RBC: 3.78 MIL/uL — AB (ref 3.87–5.11)
RDW: 13.8 % (ref 11.5–15.5)
WBC: 7.7 10*3/uL (ref 4.0–10.5)

## 2017-03-24 LAB — RAPID URINE DRUG SCREEN, HOSP PERFORMED
AMPHETAMINES: NOT DETECTED
Barbiturates: NOT DETECTED
Benzodiazepines: NOT DETECTED
Cocaine: NOT DETECTED
Opiates: NOT DETECTED
TETRAHYDROCANNABINOL: POSITIVE — AB

## 2017-03-24 LAB — CBG MONITORING, ED
GLUCOSE-CAPILLARY: 65 mg/dL (ref 65–99)
GLUCOSE-CAPILLARY: 76 mg/dL (ref 65–99)

## 2017-03-24 MED ORDER — CONCEPT DHA 53.5-38-1 MG PO CAPS: 1.0000 | ORAL_CAPSULE | Freq: Every day | ORAL | Status: DC

## 2017-03-24 MED ORDER — DOCOSAHEXAENOIC ACID 200 MG PO CAPS: 200.0000 mg | ORAL_CAPSULE | Freq: Every day | ORAL | Status: DC

## 2017-03-24 MED ORDER — ALUM & MAG HYDROXIDE-SIMETH 200-200-20 MG/5ML PO SUSP: 30.0000 mL | Freq: Four times a day (QID) | ORAL | Status: DC | PRN

## 2017-03-24 MED ORDER — FOLIC ACID 1 MG PO TABS
1.0000 mg | ORAL_TABLET | Freq: Every day | ORAL | Status: DC
  Administered 2017-03-24: 1 mg via ORAL
  Filled 2017-03-24: qty 1

## 2017-03-24 MED ORDER — LABETALOL HCL 100 MG PO TABS
100.0000 mg | ORAL_TABLET | Freq: Two times a day (BID) | ORAL | Status: DC
  Administered 2017-03-24 – 2017-03-25 (×2): 100 mg via ORAL
  Filled 2017-03-24 (×2): qty 1

## 2017-03-24 MED ORDER — VITAMIN B-6 50 MG PO TABS
50.0000 mg | ORAL_TABLET | Freq: Every day | ORAL | Status: DC
  Administered 2017-03-25: 50 mg via ORAL
  Filled 2017-03-24: qty 1

## 2017-03-24 MED ORDER — INSULIN ASPART 100 UNIT/ML ~~LOC~~ SOLN: 3.0000 [IU] | Freq: Every day | SUBCUTANEOUS | Status: DC

## 2017-03-24 MED ORDER — SERTRALINE HCL 50 MG PO TABS
25.0000 mg | ORAL_TABLET | Freq: Every day | ORAL | Status: DC
  Administered 2017-03-25: 10:00:00 via ORAL
  Filled 2017-03-24: qty 1

## 2017-03-24 MED ORDER — INSULIN DETEMIR 100 UNIT/ML ~~LOC~~ SOLN
14.0000 [IU] | Freq: Every day | SUBCUTANEOUS | Status: DC
  Administered 2017-03-24: 14 [IU] via SUBCUTANEOUS
  Filled 2017-03-24: qty 0.14

## 2017-03-24 MED ORDER — PRENATAL MULTIVITAMIN CH
1.0000 | ORAL_TABLET | Freq: Every day | ORAL | Status: DC
  Filled 2017-03-24: qty 1

## 2017-03-24 MED ORDER — ONDANSETRON HCL 4 MG PO TABS: 4.0000 mg | ORAL_TABLET | Freq: Three times a day (TID) | ORAL | Status: DC | PRN

## 2017-03-24 MED ORDER — ACETAMINOPHEN 325 MG PO TABS
650.0000 mg | ORAL_TABLET | ORAL | Status: DC | PRN
  Administered 2017-03-24: 650 mg via ORAL
  Filled 2017-03-24: qty 2

## 2017-03-24 NOTE — ED Notes (Signed)
Pt stated "I check my BS before meals and before I go to bed but I eat late because of work."

## 2017-03-24 NOTE — BHH Counselor (Signed)
Per Waylan Boga NP pt does is to be observed overnight and reevaluated by psychiatry in the [facility], [facility]

## 2017-03-24 NOTE — ED Triage Notes (Signed)
Patient sent over from OBGYN with complaints of SI. States that the Dr ask her if she ever had thoughts of hurting herself and she responded yes, and was ask to come here for eval. No plan. States that she sometimes have thoughts but "would never leave her kids".

## 2017-03-24 NOTE — BH Assessment (Addendum)
Assessment Note  Sylvia Baxter is an 36 y.o. female. Patient presents to [facility], voluntarily. She was referred to [facility] for a psychiatric evaluation. Today during her OBGYN appointment she told her doctor that she is experiencing crying spells. She also endorsed suicidal ideations with no plan. Patient continues to endorse suicidal ideations however denies intent. She states, "I could never hurt myself and leave my 2 children...they need me". Current stressors are related to her uncles recent death and the anniversary of her baby cousins death. She is also [redacted] weeks pregnant and has conflict with the father of her child. She has a history of 1 prior suicide attempt years ago by slitting her wrist. The prior suicide attempt was triggered by conflict with the father of her child. She has a history of cutting during her childhood. No HI. No history of aggressive or assaultive behaviors.  No legal issues. No AVH's. No alcohol or drug use reported. She has a outpatient therapist, Valora Piccolo. She does not have a history of INPT mental health hospitalizations.   Diagnosis: Major Depressive Disorder, Recurrent, Severe, without psychotic features  Past Medical History:  Past Medical History:  Diagnosis Date  . Genital herpes   . Gestational diabetes   . Hypertension     Past Surgical History:  Procedure Laterality Date  . CESAREAN SECTION     x2    Family History: No family history on file.  Social History:  reports that she quit smoking about 3 months ago. Her smoking use included Cigarettes. She smoked 0.50 packs per day. She has never used smokeless tobacco. She reports that she drinks alcohol. She reports that she uses drugs, including Marijuana.  Additional Social History:  Alcohol / Drug Use Pain Medications: SEE MAR Prescriptions: SEE MAR Over the Counter: SEE MAR History of alcohol / drug use?: No history of alcohol / drug abuse  CIWA: CIWA-Ar BP: (!) 134/110 Pulse Rate: 73 COWS:     Allergies: No Known Allergies  Home Medications:  (Not in a hospital admission)  OB/GYN Status:  Patient's last menstrual period was 07/19/2016 (exact date).  General Assessment Data Location of Assessment: [facility] TTS Assessment: In system Is this a Tele or Face-to-Face Assessment?: Face-to-Face Is this an Initial Assessment or a Re-assessment for this encounter?: Initial Assessment Marital status: Single Maiden name:  (Joy) Is patient pregnant?: Yes Pregnancy Status: Yes (Comment: include estimated delivery date) (35 weeks ) Living Arrangements: Other (Comment), Children (2 children (13 and 7)) Can pt return to current living arrangement?: Yes Admission Status: Voluntary Is patient capable of signing voluntary admission?: Yes Referral Source: Self/Family/Friend Insurance type:  Secretary/administrator and MCD)     [place]: Other (Comment), Children (2 children (13 and 7)) Legal Guardian: Other: (no legal guardian) Name of Psychiatrist:  (n/a) Name of Therapist:  Copy)  Education Status Is patient currently in school?: No Current Grade:  (college) Highest grade of school patient has completed:  (12th grade) Name of school:  [employer]) Sport and exercise psychologist person: n/a  Risk to self with the past 6 months Suicidal Ideation: Yes-Currently Present Has patient been a risk to self within the past 6 months prior to admission? : Yes Suicidal Intent: Yes-Currently Present Has patient had any suicidal intent within the past 6 months prior to admission? : Yes Is patient at risk for suicide?: Yes Suicidal Plan?: No Has patient had any suicidal plan within the past 6 months prior to admission? : No  Specify Current Suicidal Plan:  (no plan ) Access to Means: No What has been your use of drugs/alcohol within the last 12 months?:  (patient denies ) Previous Attempts/Gestures: Yes How many times?:  (1x-"long time ago"; slit wrist ) Other  Self Harm Risks:  (history of cutting ) Triggers for Past Attempts: Other (Comment) ("yrs ago"..... "fight with father of child") Intentional Self Injurious Behavior: Cutting (history of cutting when she was younger ) Comment - Self Injurious Behavior:  (hx of cuttin ) Family Suicide History: Unknown Recent stressful life event(s): Other (Comment), Loss (Comment), Conflict (Comment) (3rd preg; daughter has ADHD; stress w/ "baby father"; annive) Persecutory voices/beliefs?: No Depression: Yes Depression Symptoms: Tearfulness, Feeling worthless/self pity, Loss of interest in usual pleasures Substance abuse history and/or treatment for substance abuse?: No Suicide prevention information given to non-admitted patients: Not applicable  Risk to Others within the past 6 months Homicidal Ideation: No Does patient have any lifetime risk of violence toward others beyond the six months prior to admission? : No Thoughts of Harm to Others: No Current Homicidal Intent: No Current Homicidal Plan: No Access to Homicidal Means: No Identified Victim:  (n/a) History of harm to others?: No Assessment of Violence: None Noted Violent Behavior Description:  (current calm and cooperative ) Does patient have access to weapons?: No Criminal Charges Pending?: No Does patient have a court date: No Court Date:  (n/a) Is patient on probation?: No  Psychosis Hallucinations: None noted Delusions: None noted  Mental Status Report Appearance/Hygiene: In scrubs Eye Contact: Good Motor Activity: Freedom of movement Speech: Logical/coherent Level of Consciousness: Alert Mood: Depressed Affect: Appropriate to circumstance Anxiety Level: Panic Attacks Panic attack frequency:  ("on and off"...."depends on stress") Most recent panic attack:  ("last week at work") Thought Processes: Relevant Judgement: Impaired Orientation: Person, Place, Time, Situation Obsessive Compulsive Thoughts/Behaviors:  None  Cognitive Functioning Concentration: Decreased Memory: Recent Intact, Remote Intact IQ: Average Level of Function:  (n/a) Insight: Poor Impulse Control: Poor Appetite: Poor Weight Loss:  (7 pounds in 1 week ) Weight Gain:  (denies ) Sleep: Decreased Total Hours of Sleep:  (2-3 hrs per night ) Vegetative Symptoms: None  ADLScreening [facility] Assessment Services) Patient's cognitive ability adequate to safely complete daily activities?: Yes Patient able to express need for assistance with ADLs?: Yes Independently performs ADLs?: Yes (appropriate for developmental age)  Prior Inpatient Therapy Prior Inpatient Therapy: No Prior Therapy Dates:  (n/a) Prior Therapy Facilty/Provider(s):  (n/a) Reason for Treatment:  (n/a)  Prior Outpatient Therapy Prior Outpatient Therapy: Yes Prior Therapy Dates:  (current) Prior Therapy Facilty/Provider(s):  Copy) Reason for Treatment:  (therapy) Does patient have an ACCT team?: No Does patient have Intensive In-House Services?  : No Does patient have [facility] services? : No Does patient have P4CC services?: No  ADL Screening (condition at time of admission) Patient's cognitive ability adequate to safely complete daily activities?: Yes Is the patient deaf or have difficulty hearing?: No Does the patient have difficulty seeing, even when wearing glasses/contacts?: No Does the patient have difficulty concentrating, remembering, or making decisions?: No Patient able to express need for assistance with ADLs?: Yes Does the patient have difficulty dressing or bathing?: No Independently performs ADLs?: Yes (appropriate for developmental age) Does the patient have difficulty walking or climbing stairs?: No Weakness of Legs: None Weakness of Arms/Hands: None  Home Assistive Devices/Equipment Home Assistive Devices/Equipment: None    Abuse/Neglect Assessment (Assessment to be complete while patient is alone) Physical Abuse:  Denies  Verbal Abuse: Denies Sexual Abuse: Denies Exploitation of patient/patient's resources: Denies Self-Neglect: Denies Values / Beliefs Cultural Requests During Hospitalization: None Spiritual Requests During Hospitalization: None   Advance Directives (For Healthcare) Does Patient Have a Medical Advance Directive?: No Would patient like information on creating a medical advance directive?: No - Patient declined Nutrition Screen- [place] [facility] Patient's home diet: Regular        Disposition:  Disposition Initial Assessment Completed for this Encounter: Yes  On Site Evaluation by:   Reviewed with Physician:    Waldon Merl 03/24/2017 5:50 PM

## 2017-03-24 NOTE — ED Provider Notes (Signed)
[facility] DEPT Provider Note   CSN: [id number] Arrival date & time: 03/24/17  1606     History   Chief Complaint Chief Complaint  Patient presents with  . Suicidal    HPI Sylvia Baxter is a 36 y.o. female.  The patient presents for evaluation of depression, crying, and suicidal ideation.  She does not have a suicidal plan at this time.  She is [redacted] weeks pregnant, with a pregnancy complicated by diabetes.  She had a nonstress test today, which was reassuring, and plans are to follow up for twice weekly NST, next scheduled for 4 days from now.  The patient has been feeling more alone, for the last week, since her baby shower, and stopping communicating with her baby's father, about a week ago.  She is also feeling more lonely and has lost touch with several of her close friends.  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  She lives with her 2 children ages 60 and 60.  Currently those children are safe, and the patient's mother will be with him tonight.  The patient is taking her Zoloft as prescribed, by her obstetrician.  She did not require treatment for depression, pre-pregnancy.  Patient has not had any other specific illnesses during the pregnancy or other complications.  She is taking her usual medications as prescribed.  There are no other known modifying factors.  HPI  Past Medical History:  Diagnosis Date  . Genital herpes   . Gestational diabetes   . Hypertension     Patient Active Problem List   Diagnosis Date Noted  . [redacted] weeks gestation of pregnancy   . Abnormal quad screen   . Advanced maternal age in multigravida, second trimester     Past Surgical History:  Procedure Laterality Date  . CESAREAN SECTION     x2    OB History    Gravida Para Term Preterm AB Living   3 2 2    0 2   SAB TAB Ectopic Multiple Live Births           2       Home Medications    Prior to Admission medications   Medication Sig Start Date End Date Taking?  Authorizing Provider  albuterol (PROVENTIL HFA;VENTOLIN HFA) 108 (90 BASE) MCG/ACT inhaler Inhale 2 puffs into the lungs every 4 (four) hours as needed for wheezing or shortness of breath. 08/12/14  Yes Harden Mo, MD  Insulin Aspart (FIASP [place]) Inject 3 Units into the skin daily. Before Dinner   Yes [provider]  insulin detemir (LEVEMIR) 100 UNIT/ML injection Inject 14 Units into the skin at bedtime.   Yes [provider]  labetalol (NORMODYNE) 100 MG tablet Take 100 mg by mouth 2 (two) times daily.   Yes [provider]  Prenatal Vit-Fe Fumarate-FA (PRENATAL VITAMIN PO) Take 1 tablet by mouth daily.   Yes [provider]  pyridOXINE (VITAMIN B-6) 50 MG tablet Take 50 mg by mouth daily.   Yes [provider]  sertraline (ZOLOFT) 25 MG tablet Take 1 tablet once a day 01/20/17  Yes [provider]    Family History No family history on file.  Social History Social History  Substance Use Topics  . Smoking status: Former Smoker    Packs/day: 0.50    Types: Cigarettes    Quit date: 12/07/2016  . Smokeless tobacco: Never Used  . Alcohol use Yes     Allergies  Patient has no known allergies.   Review of Systems Review of Systems  All other systems reviewed and are negative.    Physical Exam Updated Vital Signs BP (!) 134/110   Pulse 73   Temp 98.1 F (36.7 C) (Oral)   Resp 20   LMP 07/19/2016 (Exact Date)   SpO2 99%   Physical Exam  Constitutional: She is oriented to person, place, and time. She appears well-developed and well-nourished.  HENT:  Head: Normocephalic and atraumatic.  Eyes: Pupils are equal, round, and reactive to light. Conjunctivae and EOM are normal.  Neck: Normal range of motion and phonation normal. Neck supple.  Cardiovascular: Normal rate.   Pulmonary/Chest: Effort normal. She exhibits no tenderness.  Abdominal:  Gravid  Musculoskeletal: Normal range of motion.  Neurological: She is  alert and oriented to person, place, and time. She exhibits normal muscle tone.  Skin: Skin is warm and dry.  Psychiatric: Her behavior is normal. Judgment and thought content normal.  She appears that  Nursing note and vitals reviewed.    ED Treatments / Results  Labs (all labs ordered are listed, but only abnormal results are displayed) Labs Reviewed  RAPID URINE DRUG SCREEN, HOSP PERFORMED - Abnormal; Notable for the following:       Result Value   Tetrahydrocannabinol POSITIVE (*)    All other components within normal limits  BASIC METABOLIC PANEL - Abnormal; Notable for the following:    CO2 20 (*)    Calcium 8.5 (*)    All other components within normal limits  CBC WITH DIFFERENTIAL/PLATELET - Abnormal; Notable for the following:    RBC 3.78 (*)    Hemoglobin 10.6 (*)    HCT 32.3 (*)    All other components within normal limits  ETHANOL  CBG MONITORING, ED    EKG  EKG Interpretation None       Radiology No results found.  Procedures Procedures (including critical care time)  Medications Ordered in ED Medications  ondansetron (ZOFRAN) tablet 4 mg (not administered)  acetaminophen (TYLENOL) tablet 650 mg (not administered)  alum & mag hydroxide-simeth (MAALOX/MYLANTA) 200-200-20 MG/5ML suspension 30 mL (not administered)  folic acid (FOLVITE) tablet 1 mg (not administered)  insulin aspart (novoLOG) injection 3 Units (3 Units Subcutaneous Not Given 03/24/17 1719)  insulin detemir (LEVEMIR) injection 14 Units (not administered)  labetalol (NORMODYNE) tablet 100 mg (not administered)  pyridOXINE (VITAMIN B-6) tablet 50 mg (not administered)  sertraline (ZOLOFT) tablet 25 mg (25 mg Oral Refused 03/24/17 1756)  prenatal multivitamin tablet 1 tablet (not administered)     Initial Impression / Assessment and Plan / ED Course  I have reviewed the triage vital signs and the nursing notes.  Pertinent labs & imaging results that were available during my care of the  patient were reviewed by me and considered in my medical decision making (see chart for details).  Clinical Course as of Mar 24 1845  Thu Mar 24, 2017  1844 At this time, the patient is medically cleared for treatment by psychiatry.  Urine drug screen positive for marijuana.  Patient pregnant, 35 weeks, with history of diabetes but no other pregnancy complications.  She had a nonstress test, today.    [EW]    Clinical Course User Index [EW] Daleen Bo, MD     Patient Vitals for the past 24 hrs:  BP Temp Temp src Pulse Resp SpO2  03/24/17 1623 (!) 134/110 98.1 F (36.7 C) Oral 73 20 99 %  TTS consult   Final Clinical Impressions(s) / ED Diagnoses   Final diagnoses:  Suicidal ideation  Depression, unspecified depression type  Marijuana abuse   Pregnancy, with depression and suicidal ideation.  Significant psychosocial stressors.  Marijuana abuse present.  Nursing Notes Reviewed/ Care Coordinated Applicable Imaging Reviewed Interpretation of Laboratory Data incorporated into ED treatment   Plan: As per TTS in conjunction with oncoming provider team   New Prescriptions New Prescriptions   No medications on file     Daleen Bo, MD 03/24/17 1846

## 2017-03-24 NOTE — ED Triage Notes (Addendum)
Error

## 2017-03-24 NOTE — ED Notes (Addendum)
Pt stated "I was at the doctor today and they asked if I had ever wanted to hurt myself.  I told them I felt I would be better off if I wasn't here but I didn't have a plan.  I have a 35 y/o son & 75 y/o daughter.  I'm scheduled for a C-section 8/21.  I've been taking Zoloft since about March.  I stopped taking them Sunday because I'm getting here to the end and I still feel the same.  I'm from [place] and don't really have a support system here.  I've thought about moving back.  The people I thought were going to be there for me aren't."

## 2017-03-25 DIAGNOSIS — Z87891 Personal history of nicotine dependence: Secondary | ICD-10-CM

## 2017-03-25 DIAGNOSIS — F1290 Cannabis use, unspecified, uncomplicated: Secondary | ICD-10-CM | POA: Diagnosis not present

## 2017-03-25 DIAGNOSIS — F4321 Adjustment disorder with depressed mood: Secondary | ICD-10-CM | POA: Diagnosis present

## 2017-03-25 DIAGNOSIS — O99343 Other mental disorders complicating pregnancy, third trimester: Secondary | ICD-10-CM | POA: Diagnosis not present

## 2017-03-25 LAB — CBG MONITORING, ED: GLUCOSE-CAPILLARY: 75 mg/dL (ref 65–99)

## 2017-03-25 NOTE — Consult Note (Signed)
[facility] Face-to-Face Psychiatry Consult   Reason for Consult:  Depression  Referring Physician:  EDP Patient Identification: Sylvia Baxter MRN:  [id number] Principal Diagnosis: Adjustment disorder with depressed mood Diagnosis:   Patient Active Problem List   Diagnosis Date Noted  . Adjustment disorder with depressed mood [F43.21] 03/25/2017    Priority: High  . [redacted] weeks gestation of pregnancy [Z3A.20]   . Abnormal quad screen [O28.0]   . Advanced maternal age in multigravida, second trimester [O09.522]     Total Time spent with patient: 45 minutes  Subjective:   Sylvia Baxter is a 36 y.o. female patient does not warrant admission.  HPI:  36 yo female who presented to the ED from her OB/GYN office after telling her doctor she had some passive, intermittent suicidal ideations.  She is upset that her boyfriend is not being as supportive as she would like and her family lives an hour away.  Stressed with multiple appointments for HTN and diabetes which has prevented her going to her weekly counseling.  Stopped taking her Zoloft on Sunday on her own because she thought it would hurt the baby; educated her that this was not the case.  No suicidal/homicidal ideations, hallucinations, or substance abuse.  She is not interested in inpatient or medications or additional counseling.  Plans to return to her counseling.  Reports she would never hurt herself as she has two children, 7 & 3 yo.  The 16 yo's father was murdered when he was five and she "would never do this to him" (attempt suicide).  Stable for discharge.  Past Psychiatric History: depression  Risk to Self: Suicidal Ideation: Not Currently  Suicidal Intent: Not Currently Is patient at risk for suicide?: denies Suicidal Plan?: No Specify Current Suicidal Plan:  (no plan ) Access to Means: No What has been your use of drugs/alcohol within the last 12 months?:  (patient denies ) How many times?:  (1x-"long time ago"; slit  wrist ) Other Self Harm Risks:  (history of cutting ) Triggers for Past Attempts: Other (Comment) ("yrs ago"..... "fight with father of child") Intentional Self Injurious Behavior: Cutting (history of cutting when she was younger ) Comment - Self Injurious Behavior:  (hx of cuttin ) Risk to Others: Homicidal Ideation: No Thoughts of Harm to Others: No Current Homicidal Intent: No Current Homicidal Plan: No Access to Homicidal Means: No Identified Victim:  (n/a) History of harm to others?: No Assessment of Violence: None Noted Violent Behavior Description:  (current calm and cooperative ) Does patient have access to weapons?: No Criminal Charges Pending?: No Does patient have a court date: No Court Date:  (n/a) Prior Inpatient Therapy: Prior Inpatient Therapy: No Prior Therapy Dates:  (n/a) Prior Therapy Facilty/Provider(s):  (n/a) Reason for Treatment:  (n/a) Prior Outpatient Therapy: Prior Outpatient Therapy: Yes Prior Therapy Dates:  (current) Prior Therapy Facilty/Provider(s):  [facility] Reason for Treatment:  (therapy) Does patient have an ACCT team?: No Does patient have Intensive In-House Services?  : No Does patient have [facility] services? : No Does patient have P4CC services?: No  Past Medical History:  Past Medical History:  Diagnosis Date  . Genital herpes   . Gestational diabetes   . Hypertension     Past Surgical History:  Procedure Laterality Date  . CESAREAN SECTION     x2   Family History: No family history on file. Family Psychiatric  History: none Social History:  History  Alcohol Use  . Yes  History  Drug Use  . Types: Marijuana    Social History   Social History  . Marital status: Single    Spouse name: N/A  . Number of children: N/A  . Years of education: N/A   Social History Main Topics  . Smoking status: Former Smoker    Packs/day: 0.50    Types: Cigarettes    Quit date: 12/07/2016  . Smokeless tobacco: Never Used  .  Alcohol use Yes  . Drug use: Yes    Types: Marijuana  . Sexual activity: Yes    Birth control/ protection: None   Other Topics Concern  . None   Social History Narrative  . None   Additional Social History:    Allergies:  No Known Allergies  Labs:  Results for orders placed or performed during the hospital encounter of 03/24/17 (from the past 48 hour(s))  Urine rapid drug screen (hosp performed)     Status: Abnormal   Collection Time: 03/24/17  5:05 PM  Result Value Ref Range   Opiates NONE DETECTED NONE DETECTED   Cocaine NONE DETECTED NONE DETECTED   Benzodiazepines NONE DETECTED NONE DETECTED   Amphetamines NONE DETECTED NONE DETECTED   Tetrahydrocannabinol POSITIVE (A) NONE DETECTED   Barbiturates NONE DETECTED NONE DETECTED    Comment:        DRUG SCREEN FOR MEDICAL PURPOSES ONLY.  IF CONFIRMATION IS NEEDED FOR ANY PURPOSE, NOTIFY LAB WITHIN 5 DAYS.        LOWEST DETECTABLE LIMITS FOR URINE DRUG SCREEN Drug Class       Cutoff (ng/mL) Amphetamine      1000 Barbiturate      200 Benzodiazepine   488 Tricyclics       891 Opiates          300 Cocaine          300 THC              50   Basic metabolic panel     Status: Abnormal   Collection Time: 03/24/17  5:15 PM  Result Value Ref Range   Sodium 137 135 - 145 mmol/L   Potassium 3.9 3.5 - 5.1 mmol/L   Chloride 109 101 - 111 mmol/L   CO2 20 (L) 22 - 32 mmol/L   Glucose, Bld 71 65 - 99 mg/dL   BUN 9 6 - 20 mg/dL   Creatinine, Ser 0.90 0.44 - 1.00 mg/dL   Calcium 8.5 (L) 8.9 - 10.3 mg/dL   GFR calc non Af Amer >60 >60 mL/min   GFR calc Af Amer >60 >60 mL/min    Comment: (NOTE) The eGFR has been calculated using the CKD EPI equation. This calculation has not been validated in all clinical situations. eGFR's persistently <60 mL/min signify possible Chronic Kidney Disease.    Anion gap 8 5 - 15  CBC with Differential     Status: Abnormal   Collection Time: 03/24/17  5:15 PM  Result Value Ref Range   WBC  7.7 4.0 - 10.5 K/uL   RBC 3.78 (L) 3.87 - 5.11 MIL/uL   Hemoglobin 10.6 (L) 12.0 - 15.0 g/dL   HCT 32.3 (L) 36.0 - 46.0 %   MCV 85.4 78.0 - 100.0 fL   MCH 28.0 26.0 - 34.0 pg   MCHC 32.8 30.0 - 36.0 g/dL   RDW 13.8 11.5 - 15.5 %   Platelets 290 150 - 400 K/uL   Neutrophils Relative % 65 %   Neutro Abs  5.0 1.7 - 7.7 K/uL   Lymphocytes Relative 27 %   Lymphs Abs 2.1 0.7 - 4.0 K/uL   Monocytes Relative 7 %   Monocytes Absolute 0.5 0.1 - 1.0 K/uL   Eosinophils Relative 1 %   Eosinophils Absolute 0.0 0.0 - 0.7 K/uL   Basophils Relative 0 %   Basophils Absolute 0.0 0.0 - 0.1 K/uL  Ethanol     Status: None   Collection Time: 03/24/17  5:15 PM  Result Value Ref Range   Alcohol, Ethyl (B) <5 <5 mg/dL    Comment:        LOWEST DETECTABLE LIMIT FOR SERUM ALCOHOL IS 5 mg/dL FOR MEDICAL PURPOSES ONLY   CBG monitoring, ED     Status: None   Collection Time: 03/24/17  5:15 PM  Result Value Ref Range   Glucose-Capillary 65 65 - 99 mg/dL  CBG monitoring, ED     Status: None   Collection Time: 03/24/17 10:08 PM  Result Value Ref Range   Glucose-Capillary 76 65 - 99 mg/dL   Comment 1 Notify RN    Comment 2 Document in Chart   CBG monitoring, ED     Status: None   Collection Time: 03/25/17  8:32 AM  Result Value Ref Range   Glucose-Capillary 75 65 - 99 mg/dL    Current Facility-Administered Medications  Medication Dose Route Frequency Provider Last Rate Last Dose  . acetaminophen (TYLENOL) tablet 650 mg  650 mg Oral Q4H PRN Daleen Bo, MD   650 mg at 03/24/17 2348  . alum & mag hydroxide-simeth (MAALOX/MYLANTA) 200-200-20 MG/5ML suspension 30 mL  30 mL Oral Q6H PRN Daleen Bo, MD      . folic acid (FOLVITE) tablet 1 mg  1 mg Oral QHS Daleen Bo, MD   1 mg at 03/24/17 2332  . insulin aspart (novoLOG) injection 3 Units  3 Units Subcutaneous Daily Daleen Bo, MD      . insulin detemir (LEVEMIR) injection 14 Units  14 Units Subcutaneous QHS Daleen Bo, MD   14 Units at  03/24/17 2333  . labetalol (NORMODYNE) tablet 100 mg  100 mg Oral BID Daleen Bo, MD   100 mg at 03/24/17 2333  . ondansetron (ZOFRAN) tablet 4 mg  4 mg Oral Q8H PRN Daleen Bo, MD      . prenatal multivitamin tablet 1 tablet  1 tablet Oral Q1200 Daleen Bo, MD      . pyridOXINE (VITAMIN B-6) tablet 50 mg  50 mg Oral Daily Daleen Bo, MD      . sertraline (ZOLOFT) tablet 25 mg  25 mg Oral Daily Daleen Bo, MD       Current Outpatient Prescriptions  Medication Sig Dispense Refill  . albuterol (PROVENTIL HFA;VENTOLIN HFA) 108 (90 BASE) MCG/ACT inhaler Inhale 2 puffs into the lungs every 4 (four) hours as needed for wheezing or shortness of breath. 1 Inhaler 12  . Insulin Aspart (FIASP [place]) Inject 3 Units into the skin daily. Before [employer]    . insulin detemir (LEVEMIR) 100 UNIT/ML injection Inject 14 Units into the skin at bedtime.    Marland Kitchen labetalol (NORMODYNE) 100 MG tablet Take 100 mg by mouth 2 (two) times daily.    . Prenatal Vit-Fe Fumarate-FA (PRENATAL VITAMIN PO) Take 1 tablet by mouth daily.    Marland Kitchen pyridOXINE (VITAMIN B-6) 50 MG tablet Take 50 mg by mouth daily.    . sertraline (ZOLOFT) 25 MG tablet Take 1 tablet once a day  Musculoskeletal: Strength & Muscle Tone: within normal limits Gait & Station: normal Patient leans: N/A  Psychiatric Specialty Exam: Physical Exam  Constitutional: She is oriented to person, place, and time. She appears well-developed and well-nourished.  HENT:  Head: Normocephalic.  Neck: Normal range of motion.  Respiratory: Effort normal.  Musculoskeletal: Normal range of motion.  Neurological: She is alert and oriented to person, place, and time.  Psychiatric: Her speech is normal and behavior is normal. Judgment and thought content normal. Cognition and memory are normal. She exhibits a depressed mood.    Review of Systems  Psychiatric/Behavioral: Positive for depression.  All other systems reviewed and are negative.   Blood  pressure 139/73, pulse 66, temperature 98.2 F (36.8 C), temperature source Oral, resp. rate 18, last menstrual period 07/19/2016, SpO2 100 %.There is no height or weight on file to calculate BMI.  General Appearance: Casual  Eye Contact:  Good  Speech:  Normal Rate  Volume:  Normal  Mood:  Irritable  Affect:  Congruent  Thought Process:  Coherent and Descriptions of Associations: Intact  Orientation:  Full (Time, Place, and Person)  Thought Content:  WDL and Logical  Suicidal Thoughts:  No  Homicidal Thoughts:  No  Memory:  Immediate;   Good Recent;   Good Remote;   Good  Judgement:  Good  Insight:  Good  Psychomotor Activity:  Normal  Concentration:  Concentration: Good and Attention Span: Good  Recall:  Good  Fund of Knowledge:  Good  Language:  Good  Akathisia:  No  Handed:  Right  AIMS (if indicated):     Assets:  Housing Leisure Time Physical Health Resilience Social Support  ADL's:  Intact  Cognition:  WNL  Sleep:        Treatment Plan Summary: Daily contact with patient to assess and evaluate symptoms and progress in treatment, Medication management and Plan adjustment disorder with depressed mood:  -Crisis stabilization -Medication management: None started, encouraged to continue her Zoloft 25 mg daily for depression -Individual counseling  Disposition: No evidence of imminent risk to self or others at present.    Waylan Boga, NP 03/25/2017 9:51 AM  Patient seen face-to-face for psychiatric evaluation, chart reviewed and case discussed with the physician extender and developed treatment plan. Reviewed the information documented and agree with the treatment plan. Corena Pilgrim, MD

## 2017-03-25 NOTE — [facility] Counselor (Signed)
Per shift report: Per Waylan Boga, NP, patient to remain in the ED for overnight observation. Pending am psych evaluation.    Vertell Novak, MS, [facility], [facility] Triage Specialist [phone]

## 2017-03-25 NOTE — ED Notes (Signed)
Patient sitting at bedside eating breakfast.

## 2017-03-25 NOTE — [facility] Suicide Risk Assessment (Signed)
Suicide Risk Assessment  Discharge Assessment   [facility] Discharge Suicide Risk Assessment   Principal Problem: Adjustment disorder with depressed mood Discharge Diagnoses:  Patient Active Problem List   Diagnosis Date Noted  . Adjustment disorder with depressed mood [F43.21] 03/25/2017    Priority: High  . [redacted] weeks gestation of pregnancy [Z3A.20]   . Abnormal quad screen [O28.0]   . Advanced maternal age in multigravida, second trimester [O09.522]     Total Time spent with patient: 45 minutes   Musculoskeletal: Strength & Muscle Tone: within normal limits Gait & Station: normal Patient leans: N/A  Psychiatric Specialty Exam: Physical Exam  Constitutional: She is oriented to person, place, and time. She appears well-developed and well-nourished.  HENT:  Head: Normocephalic.  Neck: Normal range of motion.  Respiratory: Effort normal.  Musculoskeletal: Normal range of motion.  Neurological: She is alert and oriented to person, place, and time.  Psychiatric: Her speech is normal and behavior is normal. Judgment and thought content normal. Cognition and memory are normal. She exhibits a depressed mood.    Review of Systems  Psychiatric/Behavioral: Positive for depression.  All other systems reviewed and are negative.   Blood pressure 139/73, pulse 66, temperature 98.2 F (36.8 C), temperature source Oral, resp. rate 18, last menstrual period 07/19/2016, SpO2 100 %.There is no height or weight on file to calculate BMI.  General Appearance: Casual  Eye Contact:  Good  Speech:  Normal Rate  Volume:  Normal  Mood:  Irritable  Affect:  Congruent  Thought Process:  Coherent and Descriptions of Associations: Intact  Orientation:  Full (Time, Place, and Person)  Thought Content:  WDL and Logical  Suicidal Thoughts:  No  Homicidal Thoughts:  No  Memory:  Immediate;   Good Recent;   Good Remote;   Good  Judgement:  Good  Insight:  Good  Psychomotor Activity:  Normal   Concentration:  Concentration: Good and Attention Span: Good  Recall:  Good  Fund of Knowledge:  Good  Language:  Good  Akathisia:  No  Handed:  Right  AIMS (if indicated):     Assets:  Housing Leisure Time Physical Health Resilience Social Support  ADL's:  Intact  Cognition:  WNL  Sleep:       Mental Status Per Nursing Assessment::   On Admission:   depression  Demographic Factors:  NA  Loss Factors: NA  Historical Factors: NA  Risk Reduction Factors:   Pregnancy, Responsible for children under 29 years of age, Sense of responsibility to family, Living with another person, especially a relative, Positive social support and Positive therapeutic relationship  Continued Clinical Symptoms:  Depression, mild  Cognitive Features That Contribute To Risk:  None    Suicide Risk:  Minimal: No identifiable suicidal ideation.  Patients presenting with no risk factors but with morbid ruminations; may be classified as minimal risk based on the severity of the depressive symptoms    Plan Of Care/Follow-up recommendations:  Activity:  as tolerated Diet:  heart healthy diet  Eldred Sooy, NP 03/25/2017, 10:01 AM

## 2017-03-25 NOTE — Discharge Instructions (Signed)
For your ongoing behavioral health needs, you are advised to continue treatment with Valora Piccolo, LPC:       Valora Piccolo, [address] [place]      [place], [place] [address]      [phone]

## 2017-03-25 NOTE — BH Assessment (Signed)
[facility] Assessment Progress Note  Per Corena Pilgrim, MD, this pt does not require psychiatric hospitalization at this time.  Pt is to be discharged from [facility] with recommendation to continue treatment with Valora Piccolo, [facility], her current outpatient therapist.  This has been included in pt's discharge instructions.  Pt's nurse, Caren Griffins, has been notified.  Jalene Mullet, [facility] Triage Specialist [phone]

## 2017-03-31 ENCOUNTER — Inpatient Hospital Stay (HOSPITAL_COMMUNITY): Payer: 59

## 2017-03-31 ENCOUNTER — Encounter (HOSPITAL_COMMUNITY)
Admission: AD | Disposition: A | Discharge status: Home / Self Care | Payer: Self-pay | Source: Ambulatory Visit | Attending: Obstetrics and Gynecology

## 2017-03-31 ENCOUNTER — Inpatient Hospital Stay (HOSPITAL_COMMUNITY)
Admission: AD | Admit: 2017-03-31 | Discharge: 2017-04-03 | DRG: 765 | Disposition: A | Discharge status: Home / Self Care | Payer: 59 | Source: Ambulatory Visit | Attending: Obstetrics and Gynecology | Admitting: Obstetrics and Gynecology

## 2017-03-31 ENCOUNTER — Encounter (HOSPITAL_COMMUNITY): Payer: Self-pay

## 2017-03-31 ENCOUNTER — Other Ambulatory Visit (HOSPITAL_COMMUNITY): Payer: Self-pay | Admitting: Maternal & Fetal Medicine

## 2017-03-31 ENCOUNTER — Ambulatory Visit (HOSPITAL_COMMUNITY)
Admission: RE | Admit: 2017-03-31 | Discharge: 2017-03-31 | Disposition: A | Discharge status: Home / Self Care | Payer: 59 | Source: Ambulatory Visit | Attending: Obstetrics and Gynecology | Admitting: Obstetrics and Gynecology

## 2017-03-31 ENCOUNTER — Encounter (HOSPITAL_COMMUNITY): Payer: Self-pay | Admitting: Student

## 2017-03-31 DIAGNOSIS — O99214 Obesity complicating childbirth: Secondary | ICD-10-CM | POA: Diagnosis present

## 2017-03-31 DIAGNOSIS — A6000 Herpesviral infection of urogenital system, unspecified: Secondary | ICD-10-CM | POA: Diagnosis present

## 2017-03-31 DIAGNOSIS — F419 Anxiety disorder, unspecified: Secondary | ICD-10-CM | POA: Diagnosis present

## 2017-03-31 DIAGNOSIS — O99344 Other mental disorders complicating childbirth: Secondary | ICD-10-CM | POA: Diagnosis present

## 2017-03-31 DIAGNOSIS — O99320 Drug use complicating pregnancy, unspecified trimester: Secondary | ICD-10-CM

## 2017-03-31 DIAGNOSIS — O10913 Unspecified pre-existing hypertension complicating pregnancy, third trimester: Secondary | ICD-10-CM

## 2017-03-31 DIAGNOSIS — O99213 Obesity complicating pregnancy, third trimester: Secondary | ICD-10-CM

## 2017-03-31 DIAGNOSIS — E669 Obesity, unspecified: Secondary | ICD-10-CM | POA: Diagnosis present

## 2017-03-31 DIAGNOSIS — O288 Other abnormal findings on antenatal screening of mother: Secondary | ICD-10-CM | POA: Diagnosis not present

## 2017-03-31 DIAGNOSIS — Z98891 History of uterine scar from previous surgery: Secondary | ICD-10-CM

## 2017-03-31 DIAGNOSIS — D259 Leiomyoma of uterus, unspecified: Secondary | ICD-10-CM

## 2017-03-31 DIAGNOSIS — O34211 Maternal care for low transverse scar from previous cesarean delivery: Secondary | ICD-10-CM | POA: Diagnosis present

## 2017-03-31 DIAGNOSIS — O114 Pre-existing hypertension with pre-eclampsia, complicating childbirth: Secondary | ICD-10-CM | POA: Diagnosis not present

## 2017-03-31 DIAGNOSIS — F1910 Other psychoactive substance abuse, uncomplicated: Secondary | ICD-10-CM

## 2017-03-31 DIAGNOSIS — Z3A36 36 weeks gestation of pregnancy: Secondary | ICD-10-CM

## 2017-03-31 DIAGNOSIS — O09523 Supervision of elderly multigravida, third trimester: Secondary | ICD-10-CM | POA: Diagnosis not present

## 2017-03-31 DIAGNOSIS — O3410 Maternal care for benign tumor of corpus uteri, unspecified trimester: Secondary | ICD-10-CM

## 2017-03-31 DIAGNOSIS — O3413 Maternal care for benign tumor of corpus uteri, third trimester: Secondary | ICD-10-CM | POA: Diagnosis present

## 2017-03-31 DIAGNOSIS — Z87891 Personal history of nicotine dependence: Secondary | ICD-10-CM

## 2017-03-31 DIAGNOSIS — O10919 Unspecified pre-existing hypertension complicating pregnancy, unspecified trimester: Secondary | ICD-10-CM

## 2017-03-31 DIAGNOSIS — Z362 Encounter for other antenatal screening follow-up: Secondary | ICD-10-CM

## 2017-03-31 DIAGNOSIS — O24419 Gestational diabetes mellitus in pregnancy, unspecified control: Secondary | ICD-10-CM

## 2017-03-31 DIAGNOSIS — O1002 Pre-existing essential hypertension complicating childbirth: Secondary | ICD-10-CM | POA: Diagnosis not present

## 2017-03-31 DIAGNOSIS — O24424 Gestational diabetes mellitus in childbirth, insulin controlled: Secondary | ICD-10-CM | POA: Diagnosis present

## 2017-03-31 DIAGNOSIS — O9832 Other infections with a predominantly sexual mode of transmission complicating childbirth: Secondary | ICD-10-CM | POA: Diagnosis present

## 2017-03-31 DIAGNOSIS — O10013 Pre-existing essential hypertension complicating pregnancy, third trimester: Secondary | ICD-10-CM

## 2017-03-31 DIAGNOSIS — F329 Major depressive disorder, single episode, unspecified: Secondary | ICD-10-CM | POA: Diagnosis present

## 2017-03-31 DIAGNOSIS — O1490 Unspecified pre-eclampsia, unspecified trimester: Secondary | ICD-10-CM | POA: Diagnosis present

## 2017-03-31 HISTORY — DX: Unspecified asthma, uncomplicated: J45.909

## 2017-03-31 LAB — PROTEIN / CREATININE RATIO, URINE
Creatinine, Urine: 273 mg/dL
Protein Creatinine Ratio: 0.12 mg/mg{Cre} (ref 0.00–0.15)
Total Protein, Urine: 32 mg/dL

## 2017-03-31 LAB — COMPREHENSIVE METABOLIC PANEL
ALBUMIN: 2.6 g/dL — AB (ref 3.5–5.0)
ALT: 26 U/L (ref 14–54)
AST: 24 U/L (ref 15–41)
Alkaline Phosphatase: 131 U/L — ABNORMAL HIGH (ref 38–126)
Anion gap: 6 (ref 5–15)
BILIRUBIN TOTAL: 0.7 mg/dL (ref 0.3–1.2)
BUN: 10 mg/dL (ref 6–20)
CHLORIDE: 109 mmol/L (ref 101–111)
CO2: 19 mmol/L — ABNORMAL LOW (ref 22–32)
Calcium: 8.4 mg/dL — ABNORMAL LOW (ref 8.9–10.3)
Creatinine, Ser: 0.8 mg/dL (ref 0.44–1.00)
GFR calc Af Amer: >60 mL/min (ref >60)
GFR calc non Af Amer: >60 mL/min (ref >60)
GLUCOSE: 91 mg/dL (ref 65–99)
POTASSIUM: 4 mmol/L (ref 3.5–5.1)
Sodium: 134 mmol/L — ABNORMAL LOW (ref 135–145)
Total Protein: 5.8 g/dL — ABNORMAL LOW (ref 6.5–8.1)

## 2017-03-31 LAB — GLUCOSE, CAPILLARY
GLUCOSE-CAPILLARY: 112 mg/dL — AB (ref 65–99)
Glucose-Capillary: 79 mg/dL (ref 65–99)
Glucose-Capillary: 88 mg/dL (ref 65–99)

## 2017-03-31 LAB — TYPE AND SCREEN
ABO/RH(D): O POS
Antibody Screen: NEGATIVE

## 2017-03-31 LAB — CBC
HEMATOCRIT: 31.4 % — AB (ref 36.0–46.0)
Hemoglobin: 10.2 g/dL — ABNORMAL LOW (ref 12.0–15.0)
MCH: 27.9 pg (ref 26.0–34.0)
MCHC: 32.5 g/dL (ref 30.0–36.0)
MCV: 86 fL (ref 78.0–100.0)
Platelets: 296 10*3/uL (ref 150–400)
RBC: 3.65 MIL/uL — ABNORMAL LOW (ref 3.87–5.11)
RDW: 14.1 % (ref 11.5–15.5)
WBC: 5.7 10*3/uL (ref 4.0–10.5)

## 2017-03-31 LAB — MAGNESIUM: MAGNESIUM: 3.9 mg/dL — AB (ref 1.7–2.4)

## 2017-03-31 SURGERY — Surgical Case
Anesthesia: Spinal

## 2017-03-31 MED ORDER — DIPHENHYDRAMINE HCL 25 MG PO CAPS: 25.0000 mg | ORAL_CAPSULE | Freq: Four times a day (QID) | ORAL | Status: DC | PRN

## 2017-03-31 MED ORDER — NALBUPHINE HCL 10 MG/ML IJ SOLN: 5.0000 mg | INTRAMUSCULAR | Status: DC | PRN

## 2017-03-31 MED ORDER — INSULIN ASPART 100 UNIT/ML ~~LOC~~ SOLN: 0.0000 [IU] | Freq: Three times a day (TID) | SUBCUTANEOUS | Status: DC

## 2017-03-31 MED ORDER — KETOROLAC TROMETHAMINE 30 MG/ML IJ SOLN: 30.0000 mg | Freq: Four times a day (QID) | INTRAMUSCULAR | Status: AC | PRN

## 2017-03-31 MED ORDER — PHENYLEPHRINE 8 MG IN D5W 100 ML (0.08MG/ML) PREMIX OPTIME
INJECTION | INTRAVENOUS | Status: AC
  Filled 2017-03-31: qty 100

## 2017-03-31 MED ORDER — SODIUM CHLORIDE 0.9% FLUSH
3.0000 mL | INTRAVENOUS | Status: DC | PRN
  Filled 2017-03-31: qty 3

## 2017-03-31 MED ORDER — DIPHENHYDRAMINE HCL 50 MG/ML IJ SOLN: 12.5000 mg | INTRAMUSCULAR | Status: DC | PRN

## 2017-03-31 MED ORDER — MAGNESIUM SULFATE 40 G IN LACTATED RINGERS - SIMPLE
2.0000 g/h | INTRAVENOUS | Status: AC
  Administered 2017-04-01: 2 g/h via INTRAVENOUS
  Filled 2017-03-31: qty 40
  Filled 2017-03-31: qty 500

## 2017-03-31 MED ORDER — NALBUPHINE HCL 10 MG/ML IJ SOLN: 5.0000 mg | Freq: Once | INTRAMUSCULAR | Status: DC | PRN

## 2017-03-31 MED ORDER — IBUPROFEN 800 MG PO TABS
800.0000 mg | ORAL_TABLET | Freq: Three times a day (TID) | ORAL | Status: DC | PRN
  Administered 2017-04-01 – 2017-04-03 (×3): 800 mg via ORAL
  Filled 2017-03-31 (×3): qty 1

## 2017-03-31 MED ORDER — FENTANYL CITRATE (PF) 100 MCG/2ML IJ SOLN
25.0000 ug | INTRAMUSCULAR | Status: DC | PRN
  Administered 2017-03-31: 25 ug via INTRAVENOUS
  Administered 2017-03-31: 50 ug via INTRAVENOUS

## 2017-03-31 MED ORDER — OXYTOCIN 10 UNIT/ML IJ SOLN
INTRAMUSCULAR | Status: AC
  Filled 2017-03-31: qty 4

## 2017-03-31 MED ORDER — MEPERIDINE HCL 25 MG/ML IJ SOLN
INTRAMUSCULAR | Status: AC
  Filled 2017-03-31: qty 1

## 2017-03-31 MED ORDER — OXYTOCIN 40 UNITS IN LACTATED RINGERS INFUSION - SIMPLE MED: 2.5000 [IU]/h | INTRAVENOUS | Status: AC

## 2017-03-31 MED ORDER — ALBUTEROL SULFATE (2.5 MG/3ML) 0.083% IN NEBU: 3.0000 mL | INHALATION_SOLUTION | RESPIRATORY_TRACT | Status: DC | PRN

## 2017-03-31 MED ORDER — DEXAMETHASONE SODIUM PHOSPHATE 4 MG/ML IJ SOLN
INTRAMUSCULAR | Status: DC | PRN
  Administered 2017-03-31: 4 mg via INTRAVENOUS

## 2017-03-31 MED ORDER — KETOROLAC TROMETHAMINE 30 MG/ML IJ SOLN
30.0000 mg | Freq: Four times a day (QID) | INTRAMUSCULAR | Status: AC | PRN
  Administered 2017-03-31 – 2017-04-01 (×2): 30 mg via INTRAVENOUS
  Filled 2017-03-31 (×2): qty 1

## 2017-03-31 MED ORDER — MAGNESIUM SULFATE BOLUS VIA INFUSION
4.0000 g | Freq: Once | INTRAVENOUS | Status: AC
  Administered 2017-03-31: 4 g via INTRAVENOUS
  Filled 2017-03-31: qty 500

## 2017-03-31 MED ORDER — MEPERIDINE HCL 25 MG/ML IJ SOLN
INTRAMUSCULAR | Status: DC | PRN
  Administered 2017-03-31: 25 mg via INTRAVENOUS

## 2017-03-31 MED ORDER — MORPHINE SULFATE (PF) 0.5 MG/ML IJ SOLN
INTRAMUSCULAR | Status: AC
  Filled 2017-03-31: qty 10

## 2017-03-31 MED ORDER — WITCH HAZEL-GLYCERIN EX PADS: 1.0000 "application " | MEDICATED_PAD | CUTANEOUS | Status: DC | PRN

## 2017-03-31 MED ORDER — NALOXONE HCL 2 MG/2ML IJ SOSY: 1.0000 ug/kg/h | PREFILLED_SYRINGE | INTRAVENOUS | Status: DC | PRN

## 2017-03-31 MED ORDER — ACETAMINOPHEN 325 MG PO TABS
650.0000 mg | ORAL_TABLET | ORAL | Status: DC | PRN
  Administered 2017-04-02 – 2017-04-03 (×3): 650 mg via ORAL
  Filled 2017-03-31 (×3): qty 2

## 2017-03-31 MED ORDER — SODIUM CHLORIDE 0.9 % IV SOLN: 250.0000 mL | INTRAVENOUS | Status: DC

## 2017-03-31 MED ORDER — SCOPOLAMINE 1 MG/3DAYS TD PT72
1.0000 | MEDICATED_PATCH | Freq: Once | TRANSDERMAL | Status: AC
  Administered 2017-03-31: 1.5 mg via TRANSDERMAL
  Filled 2017-03-31: qty 1

## 2017-03-31 MED ORDER — LACTATED RINGERS IV BOLUS (SEPSIS): 1000.0000 mL | Freq: Once | INTRAVENOUS | Status: DC

## 2017-03-31 MED ORDER — DEXAMETHASONE SODIUM PHOSPHATE 10 MG/ML IJ SOLN
INTRAMUSCULAR | Status: AC
  Filled 2017-03-31: qty 1

## 2017-03-31 MED ORDER — SIMETHICONE 80 MG PO CHEW
80.0000 mg | CHEWABLE_TABLET | Freq: Three times a day (TID) | ORAL | Status: DC
  Administered 2017-03-31 – 2017-04-03 (×9): 80 mg via ORAL
  Filled 2017-03-31 (×8): qty 1

## 2017-03-31 MED ORDER — SERTRALINE HCL 25 MG PO TABS
25.0000 mg | ORAL_TABLET | Freq: Every day | ORAL | Status: DC
  Administered 2017-04-01: 25 mg via ORAL
  Filled 2017-03-31 (×4): qty 1

## 2017-03-31 MED ORDER — FAMOTIDINE IN NACL 20-0.9 MG/50ML-% IV SOLN
20.0000 mg | Freq: Once | INTRAVENOUS | Status: AC
  Administered 2017-03-31: 20 mg via INTRAVENOUS
  Filled 2017-03-31: qty 50

## 2017-03-31 MED ORDER — SIMETHICONE 80 MG PO CHEW
80.0000 mg | CHEWABLE_TABLET | ORAL | Status: DC
  Administered 2017-04-01 – 2017-04-02 (×2): 80 mg via ORAL
  Filled 2017-03-31 (×2): qty 1

## 2017-03-31 MED ORDER — SENNOSIDES-DOCUSATE SODIUM 8.6-50 MG PO TABS
2.0000 | ORAL_TABLET | ORAL | Status: DC
  Administered 2017-03-31 – 2017-04-02 (×3): 2 via ORAL
  Filled 2017-03-31 (×2): qty 2

## 2017-03-31 MED ORDER — ONDANSETRON HCL 4 MG/2ML IJ SOLN: 4.0000 mg | Freq: Three times a day (TID) | INTRAMUSCULAR | Status: DC | PRN

## 2017-03-31 MED ORDER — MEPERIDINE HCL 25 MG/ML IJ SOLN: 6.2500 mg | INTRAMUSCULAR | Status: DC | PRN

## 2017-03-31 MED ORDER — LABETALOL HCL 200 MG PO TABS
200.0000 mg | ORAL_TABLET | Freq: Two times a day (BID) | ORAL | Status: DC
  Administered 2017-03-31: 200 mg via ORAL
  Filled 2017-03-31: qty 1

## 2017-03-31 MED ORDER — ACETAMINOPHEN 500 MG PO TABS
1000.0000 mg | ORAL_TABLET | Freq: Four times a day (QID) | ORAL | Status: AC
  Administered 2017-03-31 – 2017-04-01 (×3): 1000 mg via ORAL
  Filled 2017-03-31 (×3): qty 2

## 2017-03-31 MED ORDER — DIPHENHYDRAMINE HCL 25 MG PO CAPS: 25.0000 mg | ORAL_CAPSULE | ORAL | Status: DC | PRN

## 2017-03-31 MED ORDER — LABETALOL HCL 5 MG/ML IV SOLN
20.0000 mg | INTRAVENOUS | Status: AC | PRN
  Administered 2017-03-31: 40 mg via INTRAVENOUS
  Administered 2017-03-31: 20 mg via INTRAVENOUS
  Administered 2017-03-31: 80 mg via INTRAVENOUS
  Filled 2017-03-31: qty 8
  Filled 2017-03-31: qty 16
  Filled 2017-03-31: qty 4

## 2017-03-31 MED ORDER — DEXTROSE IN LACTATED RINGERS 5 % IV SOLN
INTRAVENOUS | Status: DC
  Administered 2017-04-01 (×2): via INTRAVENOUS

## 2017-03-31 MED ORDER — ZOLPIDEM TARTRATE 5 MG PO TABS: 5.0000 mg | ORAL_TABLET | Freq: Every evening | ORAL | Status: DC | PRN

## 2017-03-31 MED ORDER — LACTATED RINGERS IV SOLN
INTRAVENOUS | Status: DC | PRN
  Administered 2017-03-31: 15:00:00 via INTRAVENOUS

## 2017-03-31 MED ORDER — LACTATED RINGERS IV SOLN
INTRAVENOUS | Status: DC
  Administered 2017-03-31: 12:00:00 via INTRAVENOUS

## 2017-03-31 MED ORDER — BISACODYL 10 MG RE SUPP: 10.0000 mg | Freq: Every day | RECTAL | Status: DC | PRN

## 2017-03-31 MED ORDER — ONDANSETRON HCL 4 MG/2ML IJ SOLN
INTRAMUSCULAR | Status: DC | PRN
  Administered 2017-03-31: 4 mg via INTRAVENOUS

## 2017-03-31 MED ORDER — FENTANYL CITRATE (PF) 100 MCG/2ML IJ SOLN
INTRAMUSCULAR | Status: AC
  Filled 2017-03-31: qty 2

## 2017-03-31 MED ORDER — CEFAZOLIN SODIUM-DEXTROSE 2-4 GM/100ML-% IV SOLN
2.0000 g | INTRAVENOUS | Status: AC
  Administered 2017-03-31: 2 g via INTRAVENOUS
  Filled 2017-03-31: qty 100

## 2017-03-31 MED ORDER — COCONUT OIL OIL: 1.0000 "application " | TOPICAL_OIL | Status: DC | PRN

## 2017-03-31 MED ORDER — HYDRALAZINE HCL 20 MG/ML IJ SOLN: 10.0000 mg | Freq: Once | INTRAMUSCULAR | Status: DC | PRN

## 2017-03-31 MED ORDER — LACTATED RINGERS IV SOLN
INTRAVENOUS | Status: DC | PRN
  Administered 2017-03-31: 40 [IU] via INTRAVENOUS

## 2017-03-31 MED ORDER — DIBUCAINE 1 % RE OINT: 1.0000 "application " | TOPICAL_OINTMENT | RECTAL | Status: DC | PRN

## 2017-03-31 MED ORDER — SODIUM CHLORIDE 0.9% FLUSH
3.0000 mL | Freq: Two times a day (BID) | INTRAVENOUS | Status: DC
  Administered 2017-04-01 – 2017-04-03 (×2): 3 mL via INTRAVENOUS

## 2017-03-31 MED ORDER — SODIUM CHLORIDE 0.9% FLUSH: 3.0000 mL | INTRAVENOUS | Status: DC | PRN

## 2017-03-31 MED ORDER — METOCLOPRAMIDE HCL 5 MG/ML IJ SOLN: 10.0000 mg | Freq: Once | INTRAMUSCULAR | Status: DC | PRN

## 2017-03-31 MED ORDER — MENTHOL 3 MG MT LOZG: 1.0000 | LOZENGE | OROMUCOSAL | Status: DC | PRN

## 2017-03-31 MED ORDER — PRENATAL MULTIVITAMIN CH
1.0000 | ORAL_TABLET | Freq: Every day | ORAL | Status: DC
  Administered 2017-04-01 – 2017-04-03 (×3): 1 via ORAL
  Filled 2017-03-31 (×3): qty 1

## 2017-03-31 MED ORDER — ONDANSETRON HCL 4 MG/2ML IJ SOLN
INTRAMUSCULAR | Status: AC
  Filled 2017-03-31: qty 2

## 2017-03-31 MED ORDER — NALOXONE HCL 0.4 MG/ML IJ SOLN: 0.4000 mg | INTRAMUSCULAR | Status: DC | PRN

## 2017-03-31 MED ORDER — TETANUS-DIPHTH-ACELL PERTUSSIS 5-2.5-18.5 LF-MCG/0.5 IM SUSP: 0.5000 mL | Freq: Once | INTRAMUSCULAR | Status: DC

## 2017-03-31 MED ORDER — OXYCODONE-ACETAMINOPHEN 5-325 MG PO TABS
2.0000 | ORAL_TABLET | ORAL | Status: DC | PRN
  Administered 2017-04-01: 2 via ORAL
  Filled 2017-03-31: qty 2

## 2017-03-31 MED ORDER — SODIUM CHLORIDE 0.9 % IR SOLN
Status: DC | PRN
  Administered 2017-03-31: 1

## 2017-03-31 MED ORDER — MEASLES, MUMPS & RUBELLA VAC ~~LOC~~ INJ
0.5000 mL | INJECTION | Freq: Once | SUBCUTANEOUS | Status: DC
Start: 2017-04-01 — End: 2017-04-03

## 2017-03-31 MED ORDER — LACTATED RINGERS IV SOLN
INTRAVENOUS | Status: DC
  Administered 2017-03-31: 16:00:00 via INTRAVENOUS

## 2017-03-31 MED ORDER — SIMETHICONE 80 MG PO CHEW: 80.0000 mg | CHEWABLE_TABLET | ORAL | Status: DC | PRN

## 2017-03-31 MED ORDER — PHENYLEPHRINE 8 MG IN D5W 100 ML (0.08MG/ML) PREMIX OPTIME
INJECTION | INTRAVENOUS | Status: DC | PRN
  Administered 2017-03-31: 30 ug/min via INTRAVENOUS

## 2017-03-31 MED ORDER — OXYCODONE-ACETAMINOPHEN 5-325 MG PO TABS
1.0000 | ORAL_TABLET | ORAL | Status: DC | PRN
  Administered 2017-04-01: 1 via ORAL
  Filled 2017-03-31: qty 1

## 2017-03-31 MED ORDER — FLEET ENEMA 7-19 GM/118ML RE ENEM: 1.0000 | ENEMA | Freq: Every day | RECTAL | Status: DC | PRN

## 2017-03-31 MED ORDER — SOD CITRATE-CITRIC ACID 500-334 MG/5ML PO SOLN
30.0000 mL | Freq: Once | ORAL | Status: AC
  Administered 2017-03-31: 30 mL via ORAL
  Filled 2017-03-31: qty 15

## 2017-03-31 SURGICAL SUPPLY — 27 items
CHLORAPREP W/TINT 26ML (MISCELLANEOUS) ×2 IMPLANT
CLAMP CORD UMBIL (MISCELLANEOUS) IMPLANT
CLOTH BEACON ORANGE TIMEOUT ST (SAFETY) ×2 IMPLANT
DRSG OPSITE POSTOP 4X10 (GAUZE/BANDAGES/DRESSINGS) ×2 IMPLANT
ELECT REM PT RETURN 9FT ADLT (ELECTROSURGICAL) ×2
ELECTRODE REM PT RTRN 9FT ADLT (ELECTROSURGICAL) ×1 IMPLANT
EXTRACTOR VACUUM M CUP 4 TUBE (SUCTIONS) IMPLANT
GLOVE BIOGEL PI IND STRL 7.0 (GLOVE) ×1 IMPLANT
GLOVE BIOGEL PI INDICATOR 7.0 (GLOVE) ×1
GLOVE SURG ORTHO 8.0 STRL STRW (GLOVE) ×2 IMPLANT
GOWN STRL REUS W/TWL LRG LVL3 (GOWN DISPOSABLE) ×4 IMPLANT
KIT ABG SYR 3ML LUER SLIP (SYRINGE) ×2 IMPLANT
NEEDLE HYPO 25X5/8 SAFETYGLIDE (NEEDLE) ×4 IMPLANT
NS IRRIG 1000ML POUR BTL (IV SOLUTION) ×2 IMPLANT
PACK C SECTION WH (CUSTOM PROCEDURE TRAY) ×2 IMPLANT
PAD ABD 7.5X8 STRL (GAUZE/BANDAGES/DRESSINGS) ×4 IMPLANT
PAD OB MATERNITY 4.3X12.25 (PERSONAL CARE ITEMS) ×2 IMPLANT
PENCIL SMOKE EVAC W/HOLSTER (ELECTROSURGICAL) ×2 IMPLANT
SUT MNCRL 0 VIOLET CTX 36 (SUTURE) ×3 IMPLANT
SUT MON AB 4-0 PS1 27 (SUTURE) ×2 IMPLANT
SUT MONOCRYL 0 CTX 36 (SUTURE) ×3
SUT PDS AB 1 CT  36 (SUTURE)
SUT PDS AB 1 CT 36 (SUTURE) IMPLANT
SUT VIC AB 1 CTX 36 (SUTURE)
SUT VIC AB 1 CTX36XBRD ANBCTRL (SUTURE) IMPLANT
TOWEL OR 17X24 6PK STRL BLUE (TOWEL DISPOSABLE) ×2 IMPLANT
TRAY FOLEY BAG SILVER LF 14FR (SET/KITS/TRAYS/PACK) ×2 IMPLANT

## 2017-03-31 NOTE — MAU Note (Signed)
Sent from MFM for elevated blood pressure eval BP in MFM 170/100, 140/82, 151/94 +headache; rating 3/10 Denies any other complaints at this time.

## 2017-03-31 NOTE — ED Notes (Signed)
Report given to Erasmo Score, charge RN.  Pt ambulated and checked in to MAU for further evaluation.

## 2017-03-31 NOTE — Anesthesia Preprocedure Evaluation (Signed)
Anesthesia Evaluation  Patient identified by MRN, date of birth, ID band Patient awake    Reviewed: Allergy & Precautions, NPO status , Patient's Chart, lab work & pertinent test results  Airway Mallampati: II  TM Distance: >3 FB Neck ROM: Full    Dental no notable dental hx.    Pulmonary former smoker,    Pulmonary exam normal breath sounds clear to auscultation       Cardiovascular hypertension, Pt. on medications Normal cardiovascular exam Rhythm:Regular Rate:Normal     Neuro/Psych negative neurological ROS  negative psych ROS   GI/Hepatic negative GI ROS, Neg liver ROS,   Endo/Other  diabetes, Gestational, Insulin Dependent  Renal/GU negative Renal ROS  negative genitourinary   Musculoskeletal negative musculoskeletal ROS (+)   Abdominal   Peds negative pediatric ROS (+)  Hematology negative hematology ROS (+)   Anesthesia Other Findings   Reproductive/Obstetrics (+) Pregnancy                             Anesthesia Physical Anesthesia Plan  ASA: II  Anesthesia Plan: Spinal   Post-op Pain Management:    Induction:   PONV Risk Score and Plan:   Airway Management Planned: Natural Airway  Additional Equipment:   Intra-op Plan:   Post-operative Plan:   Informed Consent: I have reviewed the patients History and Physical, chart, labs and discussed the procedure including the risks, benefits and alternatives for the proposed anesthesia with the patient or authorized representative who has indicated his/her understanding and acceptance.   Dental advisory given  Plan Discussed with: CRNA  Anesthesia Plan Comments:         Anesthesia Quick Evaluation

## 2017-03-31 NOTE — H&P (Signed)
Sylvia Baxter is a 36 y.o. female presenting for [place] in MFM today for growth, was noted to have incr BP so sent to MAU, where BP's in severe range, requiring IV Labetalol 20>40>80mg , currently [redacted]w[redacted]d>>>discussed deliv NOW secondary to chronic HTN + severe PIH by BP parameters.  Labs pending , for R CS today . OB History    Gravida Para Term Preterm AB Living   3 2 2    0 2   SAB TAB Ectopic Multiple Live Births           2     Past Medical History:  Diagnosis Date  . Asthma   . Genital herpes   . Gestational diabetes   . Hypertension    Past Surgical History:  Procedure Laterality Date  . CESAREAN SECTION     x2  . MOUTH SURGERY     Family History: family history is not on file. Social History:  reports that she quit smoking about 3 months ago. Her smoking use included Cigarettes. She smoked 0.50 packs per day. She has never used smokeless tobacco. She reports that she drinks alcohol. She reports that she uses drugs, including Marijuana.     Maternal Diabetes: Yes:  Diabetes Type:  Insulin/Medication controlled Genetic Screening: Normal Maternal Ultrasounds/Referrals: Normal Fetal Ultrasounds or other Referrals:  None Maternal Substance Abuse:  No Significant Maternal Medications:ZOLOFT, LABETALOL, VALTREX Significant Maternal Lab Results:  None Other Comments:  None  ROS History   Blood pressure (!) 154/39, pulse 77, temperature 97.8 F (36.6 C), temperature source Oral, resp. rate 18, weight 259 lb 1.9 oz (117.5 kg), last menstrual period 07/19/2016, SpO2 98 %. Exam Physical Exam  Prenatal labs: ABO, Rh: --/--/O POS (01/09 0102) Antibody: NEG (01/09 0025) Rubella:   RPR:    HBsAg:    HIV:    GBS:     Assessment/Plan: [redacted]w[redacted]d Chronic HTN/Severe PIH Prev CS X 2 Hx HSV Hx anxiety/depression Obesity GDM req insulin>>Dr Altheimer manages Rec RCS>>proced + risks bleeding/infx//adj organ injury reviewed She declines BTL   Sylvia Baxter 03/31/2017, 1:09  PM

## 2017-03-31 NOTE — ED Notes (Signed)
Pt's initial BP this morning was 170/100, pt states she has not taken her medication this morning.  Retake was 140/82.

## 2017-03-31 NOTE — MAU Provider Note (Signed)
Chief Complaint  Patient presents with  . elevated blood pressure     First Provider Initiated Contact with Patient 03/31/17 0943      S: Sylvia Baxter  is a 36 y.o. y.o. year old [id number] female at [redacted]w[redacted]d weeks gestation who was sent to MAU from MFM  With severely elevated blood pressure of 170/100. She was getting a growth [place] for Hx CHTN and A2GDM-Insullin. Current blood pressure medication: Labetalol 100 mg PO BID. Last dose last night  Associated symptoms: 3/10 Headache, blurry vision yesterday while working on computer. None now, mild epigastric pressure during the night. None now.  Contractions: None Vaginal bleeding: None Fetal movement: nml Reports feeling lightheaded and "not right" yesterday.   Prior C/S x 2. Plans repeat unless she goes into SOL.   O:  Patient Vitals for the past 24 hrs:  BP Temp Temp src Pulse Resp SpO2 Weight  03/31/17 1144 (!) 165/98 - - 64 - - -  03/31/17 1131 (!) 153/92 - - 61 - - -  03/31/17 1116 (!) 153/94 - - 64 - - -  03/31/17 1101 (!) 136/91 - - 76 - - -  03/31/17 1046 (!) 147/96 - - 71 - - -  03/31/17 1038 (!) 137/91 - - 88 - - -  03/31/17 1015 (!) 166/97 - - 65 - - -  03/31/17 1001 (!) 151/97 - - 62 - - -  03/31/17 0945 (!) 178/105 - - 71 - - -  03/31/17 0930 (!) 168/103 97.8 F (36.6 C) Oral 68 18 98 % 259 lb 1.9 oz (117.5 kg)   General: NAD Heart: Regular rate Lungs: Normal rate and effort Abd: Soft, NT, Gravid, S=D Extremities: 1+ Pedal edema Pelvic: deferred    EFM: 145, Moderate variability, no accelerations, no decelerations Toco: UI  Results for orders placed or performed during the hospital encounter of 03/31/17 (from the past 24 hour(s))  Comprehensive metabolic panel     Status: Abnormal   Collection Time: 03/31/17  9:33 AM  Result Value Ref Range   Sodium 134 (L) 135 - 145 mmol/L   Potassium 4.0 3.5 - 5.1 mmol/L   Chloride 109 101 - 111 mmol/L   CO2 19 (L) 22 - 32 mmol/L   Glucose, Bld 91 65 - 99 mg/dL   BUN  10 6 - 20 mg/dL   Creatinine, Ser 0.80 0.44 - 1.00 mg/dL   Calcium 8.4 (L) 8.9 - 10.3 mg/dL   Total Protein 5.8 (L) 6.5 - 8.1 g/dL   Albumin 2.6 (L) 3.5 - 5.0 g/dL   AST 24 15 - 41 U/L   ALT 26 14 - 54 U/L   Alkaline Phosphatase 131 (H) 38 - 126 U/L   Total Bilirubin 0.7 0.3 - 1.2 mg/dL   GFR calc non Af Amer >60 >60 mL/min   GFR calc Af Amer >60 >60 mL/min   Anion gap 6 5 - 15  CBC     Status: Abnormal   Collection Time: 03/31/17  9:33 AM  Result Value Ref Range   WBC 5.7 4.0 - 10.5 K/uL   RBC 3.65 (L) 3.87 - 5.11 MIL/uL   Hemoglobin 10.2 (L) 12.0 - 15.0 g/dL   HCT 31.4 (L) 36.0 - 46.0 %   MCV 86.0 78.0 - 100.0 fL   MCH 27.9 26.0 - 34.0 pg   MCHC 32.5 30.0 - 36.0 g/dL   RDW 14.1 11.5 - 15.5 %   Platelets 296 150 - 400 K/uL  Protein / creatinine ratio, urine     Status: None   Collection Time: 03/31/17  9:33 AM  Result Value Ref Range   Creatinine, Urine 273.00 mg/dL   Total Protein, Urine 32 mg/dL   Protein Creatinine Ratio 0.12 0.00 - 0.15 mg/mg[Cre]   Orders Placed This Encounter  Procedures  . Comprehensive metabolic panel  . CBC  . Protein / creatinine ratio, urine  . Check blood pressure 20 minutes after giving hydrALAZINE 10 mg IV dose. Call MD if SBP >/= 160 and/or DBP >/= 110.  . Once BP goal is reached, repeat BP every 10 minutes for 1 hour, then every 15 minutes for 1 hours, then per policy for antepartum labor or post-partum.   [place] Mfm Fetal Bpp Wo Non Stress  Result Date: 03/31/2017 ----------------------------------------------------------------------  OBSTETRICS REPORT                        (Corrected Final 03/31/2017 11:08                                                                          am) ---------------------------------------------------------------------- Patient Info  ID #:       [id number]                          D.O.B.:  Mar 23, 1981 (35 yrs)  Name:       Sylvia Baxter              Visit Date: 03/31/2017 08:16 am  ---------------------------------------------------------------------- Performed By  Performed By:     Jacob Moores BS,       Ref. Address:     [facility]                    [place]                                                             [facility]                                                             [address]; [address]  [place]                                                             [id number]  Attending:        Abram Sander MD         Location:         [facility]  Referred By:      Louretta Shorten MD ---------------------------------------------------------------------- Orders   #  Description                                 Code   1  [place] MFM OB FOLLOW UP                         [phone]   2  [place] MFM FETAL BPP WO NON STRESS              76819.01  ----------------------------------------------------------------------   #  Ordered By               Order #        Accession #    Episode #   1  EMILY Arnoldo Lenis              [id number]      [id number]     [id number]   2  EMILY BUNCE              [id number]      [id number]     [id number]  ---------------------------------------------------------------------- Indications   [redacted] weeks gestation of pregnancy                Z3A.36   Abnormal biochemical screen (quad) for         O28.9   Trisomy 21 (1:188)-low risk NIPS   Hypertension - Chronic/Pre-existing            O10.019   (labetalol)   Uterine fibroids affecting pregnancy in third  O34.13, D25.9   trimester, antepartum   Obesity complicating pregnancy, third          O99.213   trimester   Advanced maternal age multigravida (33 at      O9.523   delivery) third trimester; low risk NIPS   Gestational diabetes in pregnancy, insulin     O24.414   controlled   Obesity complicating pregnancy, third          O99.213   trimester   Drug use complicating pregnancy, third         O23.323   trimester  (+THC)   Encounter for other antenatal screening        Z36.2   follow-up  ---------------------------------------------------------------------- OB History  Blood Type:            Height:         Weight (lb):  263       BMI:  Gravidity:    3         Term:   2        Prem:   0        SAB:   0  TOP:          0       Ectopic:  0  Living: 2 ---------------------------------------------------------------------- Fetal Evaluation  Num Of Fetuses:     1  Fetal Heart         150  Rate(bpm):  Cardiac Activity:   Observed  Presentation:       Cephalic  Placenta:           Posterior Fundal, above cervical os  P. Cord Insertion:  Previously Visualized  Amniotic Fluid  AFI FV:      Subjectively within normal limits  AFI Sum(cm)     %Tile       Largest Pocket(cm)  16.81           63          5.32  RUQ(cm)       RLQ(cm)       LUQ(cm)        LLQ(cm)  4.3           4.2           5.32           2.99 ---------------------------------------------------------------------- Biophysical Evaluation  Amniotic F.V:   Within normal limits       F. Tone:        Observed  F. Movement:    Observed                   Score:          8/8  F. Breathing:   Observed ---------------------------------------------------------------------- Biometry  BPD:      83.2  mm     G. Age:  33w 3d          3  %    CI:        73.52   %    70 - 86                                                          FL/HC:      22.4   %    20.1 - 22.1  HC:      308.3  mm     G. Age:  34w 3d        < 3  %    HC/AC:      0.94        0.93 - 1.11  AC:       328   mm     G. Age:  36w 5d         69  %    FL/BPD:     83.1   %    71 - 87  FL:       69.1  mm     G. Age:  35w 3d         24  %    FL/AC:      21.1   %    20 - 24  HUM:        63  mm     G. Age:  36w 4d         68  %  CER:      48.6  mm     G. Age:  N/A          > 95  %  Est. FW:    2764  gm  6 lb 1 oz     52  % ---------------------------------------------------------------------- Gestational Age  LMP:           36w  3d        Date:  07/19/16                 EDD:   04/25/17  U/S Today:     35w 0d                                        EDD:   05/05/17  Best:          36w 3d     Det. By:  LMP  (07/19/16)          EDD:   04/25/17 ---------------------------------------------------------------------- Anatomy  Cranium:               Appears normal         Aortic Arch:            Previously seen  Cavum:                 Appears normal         Ductal Arch:            Previously seen  Ventricles:            Appears normal         Diaphragm:              Appears normal  Choroid Plexus:        Previously seen        Stomach:                Appears normal, left                                                                        sided  Cerebellum:            Appears normal         Abdomen:                Previously seen  Posterior Fossa:       Previously seen        Abdominal Wall:         Previously seen  Nuchal Fold:           Not applicable (>08    Cord Vessels:           Previously seen                         wks GA)  Face:                  Orbits and profile     Kidneys:                Appear normal                         previously seen  Lips:  Previously seen        Bladder:                Appears normal  Thoracic:              Previously seen        Spine:                  Previously seen  Heart:                 Previously seen        Upper Extremities:      Previously seen  RVOT:                  Previously seen        Lower Extremities:      Previously seen  LVOT:                  Previously seen  Other:  Female gender previously seen.Heels and 5th digit previously          visualized. Nasal bone previously visualized.Open hands previously          visualized.Technically difficult due to maternal habitus and fibroids. ---------------------------------------------------------------------- Cervix Uterus Adnexa  Cervix  Not visualized (advanced GA >29wks)  Uterus  Multiple fibroids noted, see table below.  Left  Ovary  Size(cm)     4.18   x   2.18   x  2.02      Vol(ml): 9.6  Within normal limits. No adnexal mass visualized.  Right Ovary  Size(cm)     4.28   x   2.03   x  1.86      Vol(ml): 8.5  Within normal limits. No adnexal mass visualized.  Cul De Sac:   No free fluid seen.  Adnexa:       No abnormality visualized. ---------------------------------------------------------------------- Myomas   Site                     L(cm)      W(cm)      D(cm)      Location   Anterior                 6.7        6.1        3.5        Subserosal   Anterior                 2.6        3          1.8        Intramural   Anterior                 1.8        3          0.9        Intramural   Anterior                 1.8        1.8        1.3        Intramural   Posterior Left           3.6        2.7        2.6        Subserosal  ----------------------------------------------------------------------   Blood Flow  RI        PI       Comments  ---------------------------------------------------------------------- Impression  SIUP at 36+3 weeks  Normal interval anatomy; anatomic survey complete  Normal amniotic fluid volume  Appropriate interval growth with EFW at the 52%tile  Fibroid uterus: see above for size and location  BPP 8/8 ---------------------------------------------------------------------- Recommendations  Sent to MAU for further evaluation of severe range blood  pressures. ----------------------------------------------------------------------                        Abram Sander, MD Electronically Signed Corrected Final Report  03/31/2017 11:08 am ----------------------------------------------------------------------  [place] Mfm Ob Follow Up  Result Date: 03/31/2017 ----------------------------------------------------------------------  OBSTETRICS REPORT                        (Corrected Final 03/31/2017 11:08                                                                          am)  ---------------------------------------------------------------------- Patient Info  ID #:       [id number]                          D.O.B.:  12/26/1980 (35 yrs)  Name:       Sylvia Baxter              Visit Date: 03/31/2017 08:16 am ---------------------------------------------------------------------- Performed By  Performed By:     Vianne Bulls Tester BS,       Ref. Address:     [facility]                    [place]                                                             [facility]                                                             [address]; [address]  Attending:        Abram Sander MD         Location:         [facility]  Referred By:      Louretta Shorten MD ---------------------------------------------------------------------- Orders   #  Description                                 Code   1  [place] MFM OB FOLLOW UP                         [phone]   2  [place] MFM FETAL BPP WO NON STRESS              76819.01  ----------------------------------------------------------------------   #  Ordered By               Order #        Accession #    Episode #   1  EMILY Arnoldo Lenis              [id number]      [id number]     [id number]   2  EMILY BUNCE              [id number]      [id number]     [id number]  ---------------------------------------------------------------------- Indications   [redacted] weeks gestation of pregnancy                Z3A.36   Abnormal biochemical screen (quad) for         O28.9   Trisomy 21 (1:188)-low risk NIPS   Hypertension - Chronic/Pre-existing            O10.019   (labetalol)   Uterine fibroids affecting pregnancy in third  O34.13, D25.9   trimester, antepartum   Obesity complicating pregnancy, third          O99.213   trimester   Advanced maternal age multigravida (20 at      O36.523   delivery)  third trimester; low risk NIPS   Gestational diabetes in pregnancy, insulin     O24.414   controlled   Obesity complicating pregnancy, third          O99.213   trimester   Drug use complicating pregnancy, third         O63.323   trimester (+THC)   Encounter for other antenatal screening        Z36.2   follow-up  ---------------------------------------------------------------------- OB History  Blood Type:            Height:         Weight (lb):  263       BMI:  Gravidity:    3         Term:   2        Prem:   0        SAB:   0  TOP:          0       Ectopic:  0        Living: 2 ---------------------------------------------------------------------- Fetal Evaluation  Num Of Fetuses:     1  Fetal Heart         150  Rate(bpm):  Cardiac Activity:   Observed  Presentation:       Cephalic  Placenta:  Posterior Fundal, above cervical os  P. Cord Insertion:  Previously Visualized  Amniotic Fluid  AFI FV:      Subjectively within normal limits  AFI Sum(cm)     %Tile       Largest Pocket(cm)  16.81           63          5.32  RUQ(cm)       RLQ(cm)       LUQ(cm)        LLQ(cm)  4.3           4.2           5.32           2.99 ---------------------------------------------------------------------- Biophysical Evaluation  Amniotic F.V:   Within normal limits       F. Tone:        Observed  F. Movement:    Observed                   Score:          8/8  F. Breathing:   Observed ---------------------------------------------------------------------- Biometry  BPD:      83.2  mm     G. Age:  33w 3d          3  %    CI:        73.52   %    70 - 86                                                          FL/HC:      22.4   %    20.1 - 22.1  HC:      308.3  mm     G. Age:  34w 3d        < 3  %    HC/AC:      0.94        0.93 - 1.11  AC:       328   mm     G. Age:  36w 5d         69  %    FL/BPD:     83.1   %    71 - 87  FL:       69.1  mm     G. Age:  35w 3d         24  %    FL/AC:      21.1   %    20 - 24  HUM:        63  mm      G. Age:  36w 4d         68  %  CER:      48.6  mm     G. Age:  N/A          > 95  %  Est. FW:    2764  gm      6 lb 1 oz     52  % ---------------------------------------------------------------------- Gestational Age  LMP:           36w 3d        Date:  07/19/16  EDD:   04/25/17  U/S Today:     35w 0d                                        EDD:   05/05/17  Best:          36w 3d     Det. By:  LMP  (07/19/16)          EDD:   04/25/17 ---------------------------------------------------------------------- Anatomy  Cranium:               Appears normal         Aortic Arch:            Previously seen  Cavum:                 Appears normal         Ductal Arch:            Previously seen  Ventricles:            Appears normal         Diaphragm:              Appears normal  Choroid Plexus:        Previously seen        Stomach:                Appears normal, left                                                                        sided  Cerebellum:            Appears normal         Abdomen:                Previously seen  Posterior Fossa:       Previously seen        Abdominal Wall:         Previously seen  Nuchal Fold:           Not applicable (>10    Cord Vessels:           Previously seen                         wks GA)  Face:                  Orbits and profile     Kidneys:                Appear normal                         previously seen  Lips:                  Previously seen        Bladder:                Appears normal  Thoracic:              Previously seen        Spine:  Previously seen  Heart:                 Previously seen        Upper Extremities:      Previously seen  RVOT:                  Previously seen        Lower Extremities:      Previously seen  LVOT:                  Previously seen  Other:  Female gender previously seen.Heels and 5th digit previously          visualized. Nasal bone previously visualized.Open hands previously          visualized.Technically  difficult due to maternal habitus and fibroids. ---------------------------------------------------------------------- Cervix Uterus Adnexa  Cervix  Not visualized (advanced GA >29wks)  Uterus  Multiple fibroids noted, see table below.  Left Ovary  Size(cm)     4.18   x   2.18   x  2.02      Vol(ml): 9.6  Within normal limits. No adnexal mass visualized.  Right Ovary  Size(cm)     4.28   x   2.03   x  1.86      Vol(ml): 8.5  Within normal limits. No adnexal mass visualized.  Cul De Sac:   No free fluid seen.  Adnexa:       No abnormality visualized. ---------------------------------------------------------------------- Myomas   Site                     L(cm)      W(cm)      D(cm)      Location   Anterior                 6.7        6.1        3.5        Subserosal   Anterior                 2.6        3          1.8        Intramural   Anterior                 1.8        3          0.9        Intramural   Anterior                 1.8        1.8        1.3        Intramural   Posterior Left           3.6        2.7        2.6        Subserosal  ----------------------------------------------------------------------   Blood Flow                 RI        PI       Comments  ---------------------------------------------------------------------- Impression  SIUP at 36+3 weeks  Normal interval anatomy; anatomic survey complete  Normal amniotic fluid volume  Appropriate interval growth with EFW at the 52%tile  Fibroid uterus: see above for size and location  BPP 8/8 ---------------------------------------------------------------------- Recommendations  Sent to MAU for further evaluation of severe range blood  pressures. ----------------------------------------------------------------------                        Abram Sander, MD Electronically Signed Corrected Final Report  03/31/2017 11:08 am ----------------------------------------------------------------------    Meds ordered this encounter  Medications  . labetalol  (NORMODYNE,TRANDATE) injection 20-80 mg  . hydrALAZINE (APRESOLINE) injection 10 mg   Severe HTN protocol initiated.   Care of pt turned over to Jorje Guild, NP at 1005.  Tamala Julian, [place], [place] 03/31/2017 10:23 AM    Fetal tracing initially reassuring but not reactive -- BPP in MFM today 8/8 w/normal AFI Patient given IV labetalol (20 mg, 40 mg) for severe range BPs. BPs improved but still elevated, most recent BP back to severe range.  S/w Dr. Matthew Saras regarding labs, ultrasound, BPs, & tx.  Dr. Matthew Saras on unit to discuss plan of care.   Jorje Guild, NP

## 2017-03-31 NOTE — Transfer of Care (Signed)
Immediate Anesthesia Transfer of Care Note  Patient: Sylvia Baxter  Procedure(s) Performed: Procedure(s): CESAREAN SECTION (N/A)  Patient Location: PACU  Anesthesia Type:Spinal  Level of Consciousness: awake, alert  and oriented  Airway & Oxygen Therapy: Patient Spontanous Breathing  Post-op Assessment: Report given to RN and Post -op Vital signs reviewed and stable  Post vital signs: Reviewed and stable  Last Vitals:  Vitals:   03/31/17 1219 03/31/17 1231  BP: (!) 157/81 (!) 154/39  Pulse: 71 77  Resp:    Temp:      Last Pain:  Vitals:   03/31/17 0931  TempSrc:   PainSc: 3       Patients Stated Pain Goal: 0 (54/98/26 [id number])  Complications: No apparent anesthesia complications

## 2017-03-31 NOTE — Op Note (Signed)
Preoperative diagnosis: 14 and 37 week IUP, chronic hypertension with severe superimposed preeclampsia, history of leiomyoma, history of HSV, insulin requiring gestational diabetes   postoperative diagnosis: Same  Procedure: Repeat low transverse cesarean section  Surgeon: Matthew Saras  Anesthesia: Spinal  EBL: 800 cc  Procedure and findings:  The patient taken the operating room after an adequate level spinal anesthetic pain patient in left tilt position the abdomen prepped and draped appropriate timeouts were taken at that point. Foley catheter was position. Prior to this and MAUs, she had received first labetalol 20 mg, 40 mg, followed by 80 mg with normalization of her blood pressure. She had been on by mouth labetalol.  Transverse incision made through the old scar which is fairly well-healed this is carried down the fascia which was incised and extended transversely. Rectus muscles divided in the midline, peritoneum entered superiorly without incident and extended in a vertical fashion. The vesicouterine serosa was incised and dissected for a short measured below. There was a fair amount of bladder flap scarring so the initial uterine incision was higher to avoid that area. This is carried down and still clear fluid was noted extended with bandage scissors a vertex presentation easy delivery, the infant was vigorous at delivery, cord pH and cord samples obtained passed the pediatric team for further care. The placenta was then delivered manually intact sent to pathology. The uterine cavity was wiped clean. The uterus could not be exteriorized due to larger fibroids were noted anteriorly and posteriorly. Uterine incision closed with a running locked 0 chromic suture followed by an imbricating layer of chromic this was hemostatic the area the bladder flap was well below the urine remained clear throughout. Prior to closure sponge, needle, history counts reported as correct 2 peritoneum closed with a  2-0 Vicryl running suture the same on the rectus muscles in the midline. 0 PDS was then used from laterally to midline on either side. The septated tissue was undermined to reduce tension and made hemostatic with the Bovie this was fairly thin was not closed separately 4-0 Monocryl subcuticular closure with pressure dressing applied. Clear urine noted at the end of the case. She tolerated this well went to recovery room in good condition. This will be started on magnesium sulfate, by mouth labetalol and sliding-scale insulin protocol.  Dictated with RalstonD.

## 2017-04-01 LAB — GLUCOSE, CAPILLARY: Glucose-Capillary: 104 mg/dL — ABNORMAL HIGH (ref 65–99)

## 2017-04-01 LAB — CBC
HEMATOCRIT: 29.4 % — AB (ref 36.0–46.0)
Hemoglobin: 9.6 g/dL — ABNORMAL LOW (ref 12.0–15.0)
MCH: 28.1 pg (ref 26.0–34.0)
MCHC: 32.7 g/dL (ref 30.0–36.0)
MCV: 86 fL (ref 78.0–100.0)
Platelets: 265 10*3/uL (ref 150–400)
RBC: 3.42 MIL/uL — AB (ref 3.87–5.11)
RDW: 14.2 % (ref 11.5–15.5)
WBC: 10.7 10*3/uL — AB (ref 4.0–10.5)

## 2017-04-01 MED ORDER — OXYCODONE HCL 5 MG PO TABS
5.0000 mg | ORAL_TABLET | ORAL | Status: DC | PRN
  Administered 2017-04-01 – 2017-04-02 (×4): 5 mg via ORAL
  Administered 2017-04-02: 10 mg via ORAL
  Administered 2017-04-03: 5 mg via ORAL
  Administered 2017-04-03: 10 mg via ORAL
  Filled 2017-04-01: qty 1
  Filled 2017-04-01: qty 2
  Filled 2017-04-01 (×3): qty 1
  Filled 2017-04-01: qty 2
  Filled 2017-04-01: qty 1

## 2017-04-01 MED ORDER — SODIUM CHLORIDE 0.9% FLUSH: 3.0000 mL | INTRAVENOUS | Status: DC | PRN

## 2017-04-01 MED ORDER — LABETALOL HCL 200 MG PO TABS
300.0000 mg | ORAL_TABLET | Freq: Two times a day (BID) | ORAL | Status: DC
  Administered 2017-04-01 – 2017-04-02 (×4): 300 mg via ORAL
  Filled 2017-04-01 (×4): qty 1

## 2017-04-01 MED ORDER — SODIUM CHLORIDE 0.9% FLUSH
3.0000 mL | Freq: Two times a day (BID) | INTRAVENOUS | Status: DC
  Administered 2017-04-01 – 2017-04-03 (×4): 3 mL via INTRAVENOUS

## 2017-04-01 NOTE — Progress Notes (Addendum)
Subjective: Postpartum Day 1: Cesarean Delivery - rCS s/s SIPE. Patient reports tolerating PO.  Feels well - pain c/w PO pain meds. VF. Ambulating. Passing gas.   Objective: Vital signs in last 24 hours: Temp:  [97.5 F (36.4 C)-98.9 F (37.2 C)] 97.7 F (36.5 C) (08/03 0857) Pulse Rate:  [51-80] 71 (08/03 1035) Resp:  [12-20] 16 (08/03 0915) BP: (125-172)/(39-100) 125/76 (08/03 1035) SpO2:  [98 %-100 %] 100 % (08/03 0857)  Physical Exam:  General: alert, cooperative and appears stated age Lochia: appropriate Uterine Fundus: firm Incision: healing well, no significant drainage, no dehiscence, no significant erythema DVT Evaluation: No evidence of DVT seen on physical exam. Negative Homan's sign. No cords or calf tenderness. No significant calf/ankle edema.   Recent Labs  03/31/17 0933 04/01/17 0559  HGB 10.2* 9.6*  HCT 31.4* 29.4*    Assessment/Plan: Status post Cesarean section. Doing well postoperatively.  Continue current care. Increase labetalol to 300mg  BID. No s/s severe disease.   Tyson Dense 04/01/2017, 10:55 AM

## 2017-04-01 NOTE — Lactation Note (Signed)
This note was copied from a baby's chart. Lactation Consultation Note  Patient Name: Sylvia Baxter [id number] Date: 04/01/2017 Reason for consult: Initial assessment;NICU baby  Baby 99 hours old. Mom called this LC to ask how long she should use DEBP. Enc mom to pump for 15 minutes and discussed timer on DEBP. Enc mom to hand express after pumping. Mom declined any further assistance from [facility] so enc mom to call for assistance as needed.   Maternal Data Does the patient have breastfeeding experience prior to this delivery?: No  Feeding    LATCH Score                   Interventions    Lactation Tools Discussed/Used Pump Review: Setup, frequency, and cleaning;Milk Storage Initiated by:: bedside RN Date initiated:: 03/31/17   Consult Status Consult Status: Follow-up Date: 04/01/17 Follow-up type: In-patient    Andres Labrum 04/01/2017, 1:31 PM

## 2017-04-01 NOTE — Progress Notes (Signed)
CSW acknowledges consult.  CSW attempted to meet with MOB, however MOB had several room guest.  CSW will attempt to visit with MOB at a later time.   Kameryn Tisdel Boyd-Gilyard, MSW, LCSW Clinical Social Work [phone]  

## 2017-04-01 NOTE — Anesthesia Postprocedure Evaluation (Signed)
Anesthesia Post Note  Patient: Geneticist, molecular  Procedure(s) Performed: Procedure(s) (LRB): CESAREAN SECTION (N/A)     Patient location during evaluation: [facility] Anesthesia Type: Spinal Level of consciousness: oriented and awake and alert Pain management: pain level controlled Vital Signs Assessment: post-procedure vital signs reviewed and stable Respiratory status: spontaneous breathing, respiratory function stable and patient connected to nasal cannula oxygen Cardiovascular status: blood pressure returned to baseline and stable Postop Assessment: no headache, no backache and patient able to bend at knees Anesthetic complications: no    Last Vitals:  Vitals:   04/01/17 0500 04/01/17 0533  BP:  127/81  Pulse:  (!) 59  Resp: 20 16  Temp:  (!) 36.4 C    Last Pain:  Vitals:   04/01/17 0810  TempSrc:   PainSc: 0-No pain   Pain Goal: Patients Stated Pain Goal: 0 (03/31/17 0931)               Rayvon Char

## 2017-04-01 NOTE — Lactation Note (Signed)
This note was copied from a baby's chart. Lactation Consultation Note  Patient Name: Sylvia Baxter Today's Date: 04/01/2017 Reason for consult: Initial assessment;NICU baby  NICU baby 33 hours old. Mom distracted, on her cell phone, but asked LC to continue with consultation. Mom reports that she has used DEBP once, and is not sure she knows how to use alone yet. Mom states that she did not nurse first 2 children, but her milk did "come in," she thinks. Enc mom to pump every 2-3 hours for a total of 8-12 times/24 hours followed by hand expression. Mom reports that she knows how to hand express. Mom still on cell phone, calling her employer, so mom has this LC's extension on erasure board and enc to call if needed assistance.  Maternal Data Does the patient have breastfeeding experience prior to this delivery?: No  Feeding    LATCH Score                   Interventions    Lactation Tools Discussed/Used Pump Review: Setup, frequency, and cleaning;Milk Storage Initiated by:: bedside RN Date initiated:: 03/31/17   Consult Status Consult Status: Follow-up Date: 04/01/17 Follow-up type: In-patient    Andres Labrum 04/01/2017, 11:11 AM

## 2017-04-02 NOTE — Lactation Note (Signed)
This note was copied from a baby's chart. Lactation Consultation Note: Mother reports that she is continuing to pump every 2-3 hours. Mother reports that she is pumping a few drops at this time. Encouraged mother to continue to pump and to hand express frequently. Mother is able to get drops when she hand expresses. Mother declines questions or concerns.   Patient Name: Sylvia Baxter [id number] Date: 04/02/2017     Maternal Data    Feeding Feeding Type: Donor Breast Milk Nipple Type: Slow - flow Length of feed: 30 min  LATCH Score                   Interventions    Lactation Tools Discussed/Used     Consult Status      Darla Lesches 04/02/2017, 4:15 PM

## 2017-04-02 NOTE — Plan of Care (Signed)
Problem: Nutritional: Goal: Mothers verbalization of comfort with breastfeeding process will improve Outcome: Progressing RN and patient discussed pumping schedule.  Educated regarding the benefits of pumping for NICU baby.   Problem: Role Relationship: Goal: Ability to demonstrate positive interaction with newborn will improve Outcome: Progressing Patient tearful regarding NICU status of newborn. Patient discussed positive interaction and visit with newborn this am.   Problem: Skin Integrity: Goal: Demonstration of wound healing without infection will improve Outcome: Progressing Assessment of incision site: clean, dry and intact.  Patient educated regarding signs of infection.

## 2017-04-02 NOTE — Progress Notes (Signed)
Subjective: Postpartum Day 2: Cesarean Delivery Patient reports tolerating PO, + flatus and no problems voiding.    Objective: Vital signs in last 24 hours: Temp:  [97.7 F (36.5 C)-98.8 F (37.1 C)] 98.4 F (36.9 C) (08/04 0800) Pulse Rate:  [57-71] 68 (08/04 0800) Resp:  [16-20] 20 (08/04 0800) BP: (109-137)/(61-78) 137/73 (08/04 0800) SpO2:  [98 %-100 %] 98 % (08/04 0800)  Physical Exam:  General: alert, cooperative and appears stated age 36: appropriate Uterine Fundus: firm Incision: healing well, no significant drainage, no dehiscence, no significant erythema DVT Evaluation: No evidence of DVT seen on physical exam. Negative Homan's sign. No cords or calf tenderness. No significant calf/ankle edema.   Recent Labs  03/31/17 0933 04/01/17 0559  HGB 10.2* 9.6*  HCT 31.4* 29.4*    Assessment/Plan: Status post Cesarean section. Doing well postoperatively.  BP better c/w Labetalol 300mg  TID - ctm inpatient for BP x 1 more day.  Tyson Dense 04/02/2017, 8:48 AM

## 2017-04-03 ENCOUNTER — Encounter (HOSPITAL_COMMUNITY): Payer: Self-pay

## 2017-04-03 MED ORDER — DOCUSATE SODIUM 100 MG PO CAPS: 100.0000 mg | ORAL_CAPSULE | Freq: Two times a day (BID) | ORAL | 2 refills | Status: AC

## 2017-04-03 MED ORDER — FERROUS SULFATE 325 (65 FE) MG PO TBEC: 325.0000 mg | DELAYED_RELEASE_TABLET | Freq: Two times a day (BID) | ORAL | 2 refills | Status: AC

## 2017-04-03 MED ORDER — HYDROCODONE-ACETAMINOPHEN 7.5-300 MG PO TABS: 1.0000 | ORAL_TABLET | Freq: Four times a day (QID) | ORAL | 0 refills | Status: AC

## 2017-04-03 MED ORDER — LABETALOL HCL 100 MG PO TABS: 100.0000 mg | ORAL_TABLET | Freq: Two times a day (BID) | ORAL | 0 refills | Status: DC

## 2017-04-03 MED ORDER — LABETALOL HCL 200 MG PO TABS
400.0000 mg | ORAL_TABLET | Freq: Two times a day (BID) | ORAL | Status: DC
  Administered 2017-04-03: 400 mg via ORAL
  Filled 2017-04-03: qty 2

## 2017-04-03 NOTE — Discharge Planning (Signed)
Pt. Is discharged. Her discharge summary has been provided. However there was not a note about her labetalol dosage of 400 mg/TID. I spoke with Dr. Royston Sinner. Dr. Royston Sinner, asked me to reiterate this fact to her, that she was not able to change the AVS. I verified verbally that the correct dose for Sylvia Baxter to take for labetalol was 400mg / TID. I wrote these instructions on her AVS. Sylvia Baxter verified she would be going to [facility] tomorrow and would ask for the dosage/prescription to be updated. Sylvia Baxter verified she has enough labetalol (100mg  tablets) to cover her until her appointment.

## 2017-04-03 NOTE — Discharge Summary (Addendum)
Obstetric Discharge Summary Reason for Admission: cesarean section for SIPE at 36.3wga. HO GDM - insulin required. Prenatal Procedures: Preeclampsia Intrapartum Procedures: cesarean: low cervical, transverse Postpartum Procedures: none Complications-Operative and Postpartum: none Hemoglobin  Date Value Ref Range Status  04/01/2017 9.6 (L) 12.0 - 15.0 g/dL Final   HCT  Date Value Ref Range Status  04/01/2017 29.4 (L) 36.0 - 46.0 % Final    Physical Exam:  General: alert, cooperative and appears stated age 36: appropriate Uterine Fundus: firm Incision: healing well, no significant drainage, no dehiscence, no significant erythema DVT Evaluation: No evidence of DVT seen on physical exam. Negative Homan's sign. No cords or calf tenderness. No significant calf/ankle edema.  Discharge Diagnoses: 36 wga pregnany w/SIPE, delivered via rCS  Discharge Information: Date: 04/03/2017 Activity: pelvic rest Diet: routine Medications: Ibuprofen, Colace, Iron and Vicodin Condition: stable Instructions: refer to practice specific booklet Discharge to: home   Allergies as of 04/03/2017      Reactions   Kiwi Extract Other (See Comments)   Lis swell and breakout      Medication List    STOP taking these medications   FIASP Shannon   insulin detemir 100 UNIT/ML injection Commonly known as:  LEVEMIR   labetalol 100 MG tablet Commonly known as:  NORMODYNE   PRENATAL VITAMIN PO   pyridOXINE 50 MG tablet Commonly known as:  VITAMIN B-6   TYLENOL PO   valACYclovir 500 MG tablet Commonly known as:  VALTREX     TAKE these medications   albuterol 108 (90 Base) MCG/ACT inhaler Commonly known as:  PROVENTIL HFA;VENTOLIN HFA Inhale 2 puffs into the lungs every 4 (four) hours as needed for wheezing or shortness of breath.   calcium carbonate 500 MG chewable tablet Commonly known as:  TUMS - dosed in mg elemental calcium Chew 1 tablet by mouth daily.   docusate sodium 100 MG  capsule Commonly known as:  COLACE Take 1 capsule (100 mg total) by mouth 2 (two) times daily.   ferrous sulfate 325 (65 FE) MG EC tablet Take 1 tablet (325 mg total) by mouth 2 (two) times daily.   Hydrocodone-Acetaminophen 7.5-300 MG Tabs Take 1 tablet by mouth every 6 (six) hours.   sertraline 25 MG tablet Commonly known as:  ZOLOFT Take 1 tablet once a day       BP meds titrated to Labetalol 400mg  BID (from 100mg  BID) with better controlled.   Make an appt to have BP checked and incision checked within the week. Needs FBS at 6 week visit.   Newborn Data: Live born female  Birth Weight: 6 lb 0.3 oz (2730 g) APGAR: 4, 4  Home with mother.  Tyson Dense 04/03/2017, 10:55 AM

## 2017-04-19 ENCOUNTER — Inpatient Hospital Stay (HOSPITAL_COMMUNITY): Admit: 2017-04-19 | Payer: 59 | Admitting: Obstetrics and Gynecology

## 2017-05-17 ENCOUNTER — Encounter (HOSPITAL_COMMUNITY): Payer: Self-pay | Admitting: Obstetrics and Gynecology

## 2017-11-25 DIAGNOSIS — R739 Hyperglycemia, unspecified: Secondary | ICD-10-CM | POA: Diagnosis not present

## 2017-11-25 DIAGNOSIS — I10 Essential (primary) hypertension: Secondary | ICD-10-CM | POA: Diagnosis not present

## 2018-01-06 DIAGNOSIS — Z0000 Encounter for general adult medical examination without abnormal findings: Secondary | ICD-10-CM | POA: Diagnosis not present

## 2018-01-13 DIAGNOSIS — Z0000 Encounter for general adult medical examination without abnormal findings: Secondary | ICD-10-CM | POA: Diagnosis not present

## 2018-01-13 DIAGNOSIS — R739 Hyperglycemia, unspecified: Secondary | ICD-10-CM | POA: Diagnosis not present

## 2018-01-13 DIAGNOSIS — I10 Essential (primary) hypertension: Secondary | ICD-10-CM | POA: Diagnosis not present

## 2018-02-10 DIAGNOSIS — I10 Essential (primary) hypertension: Secondary | ICD-10-CM | POA: Diagnosis not present

## 2018-02-10 DIAGNOSIS — R739 Hyperglycemia, unspecified: Secondary | ICD-10-CM | POA: Diagnosis not present

## 2018-03-25 IMAGING — US US MFM OB DETAIL+14 WK
1 series · 13 of 28 positions shown · non-contrast
Comparison: none

[Series 1: us mfm ob detail+14 wk · 13 of 81 slices shown]
[im 3/81]
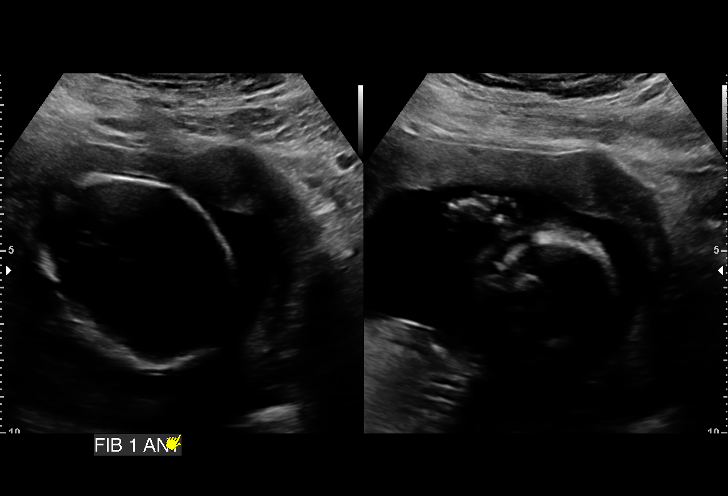
[im 9/81]
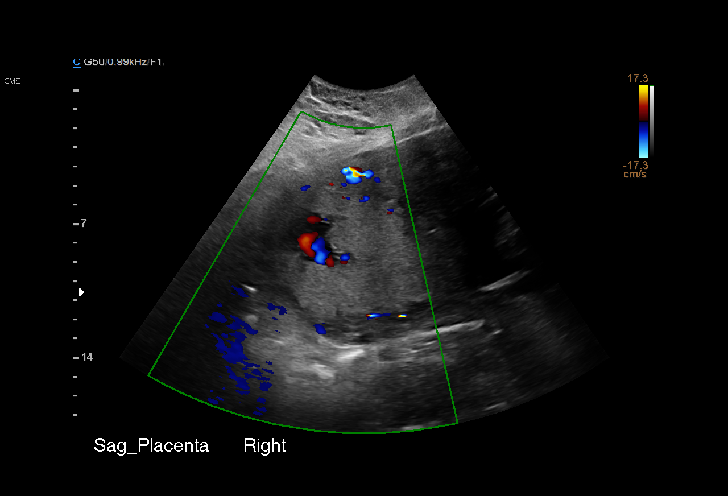
[im 15/81]
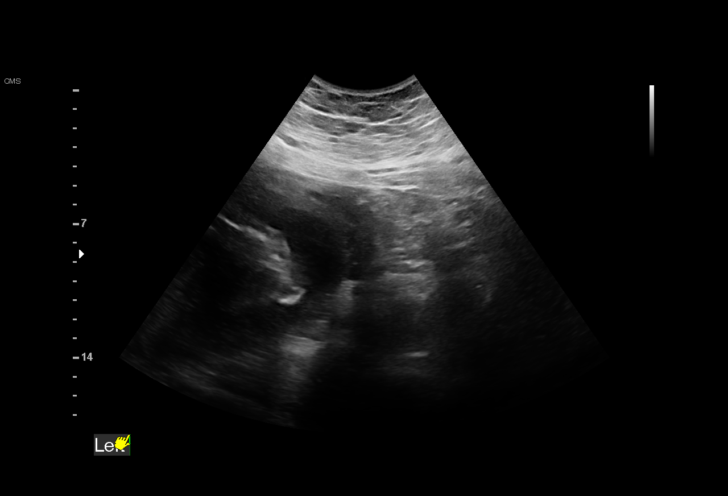
[im 21/81]
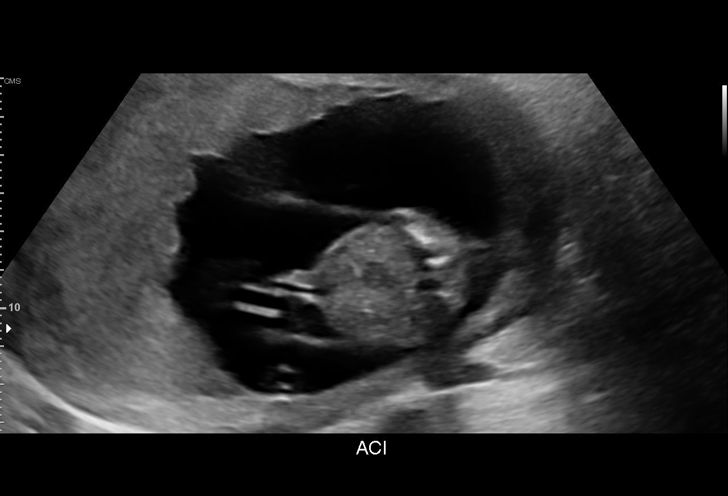
[im 27/81]
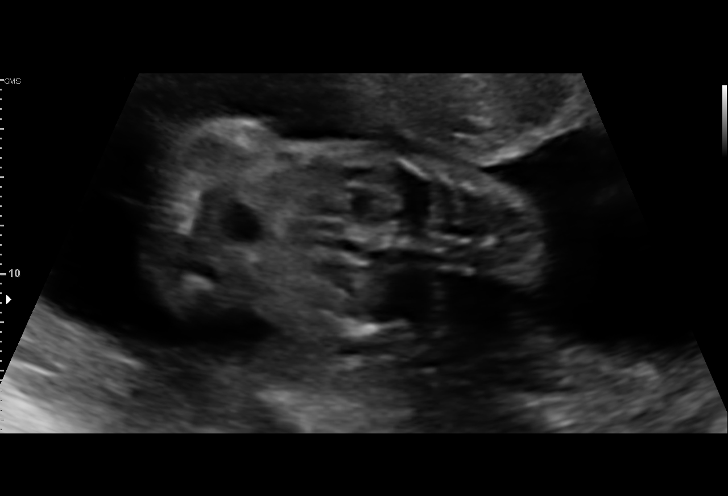
[im 33/81]
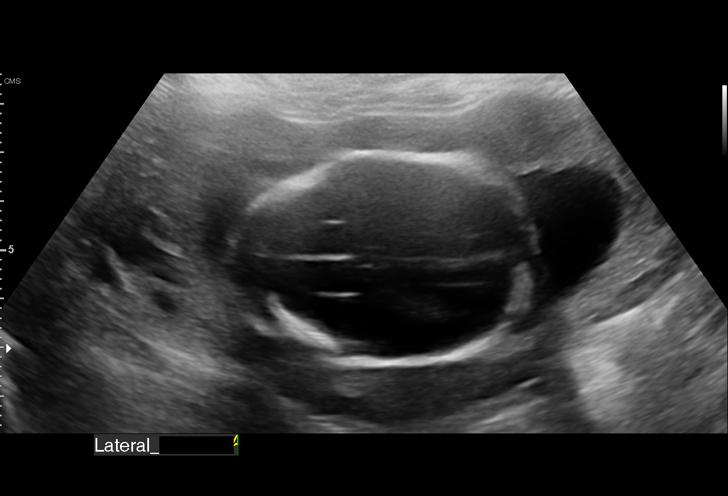
[im 42/81]
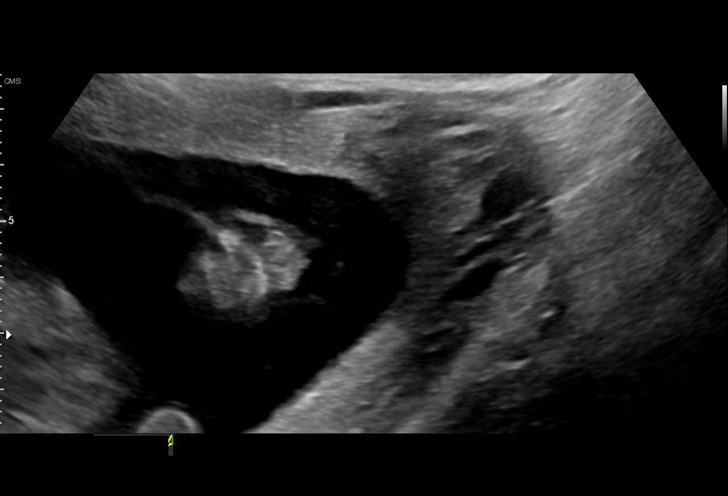
[im 48/81]
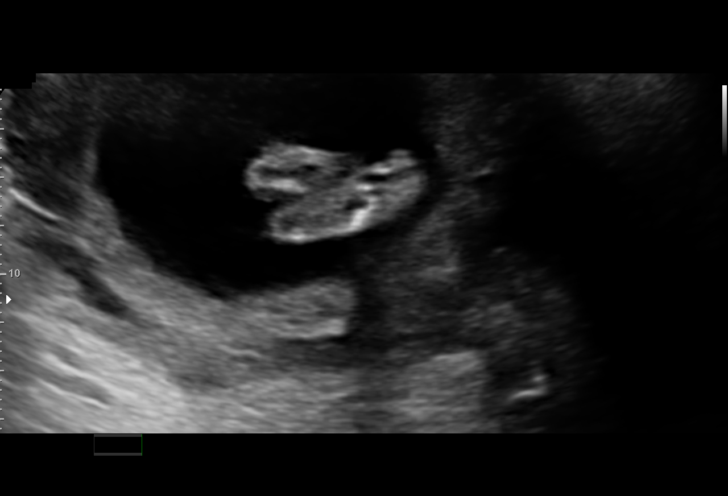
[im 54/81]
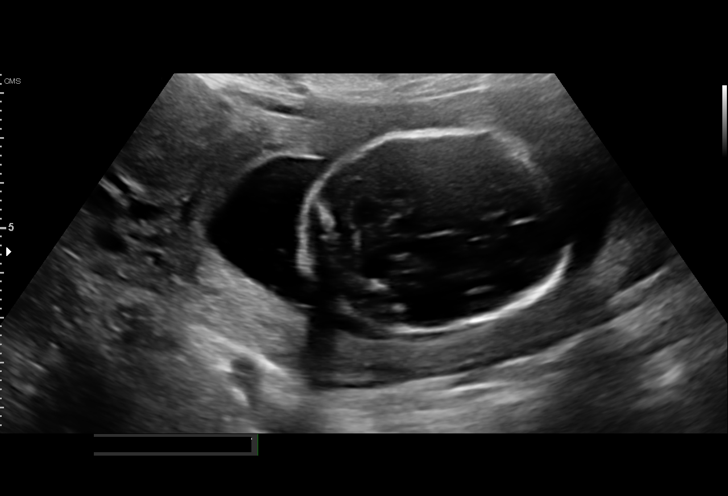
[im 60/81]
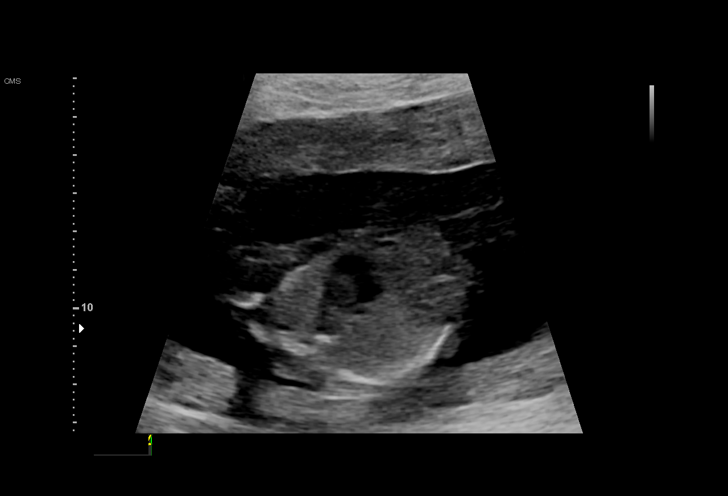
[im 66/81]
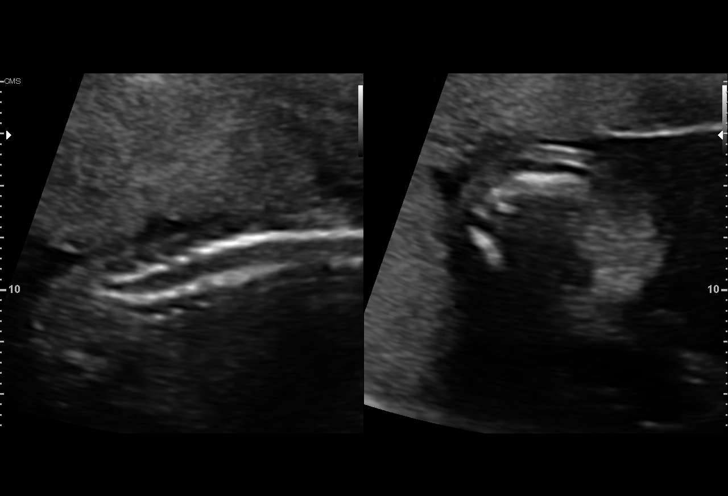
[im 72/81]
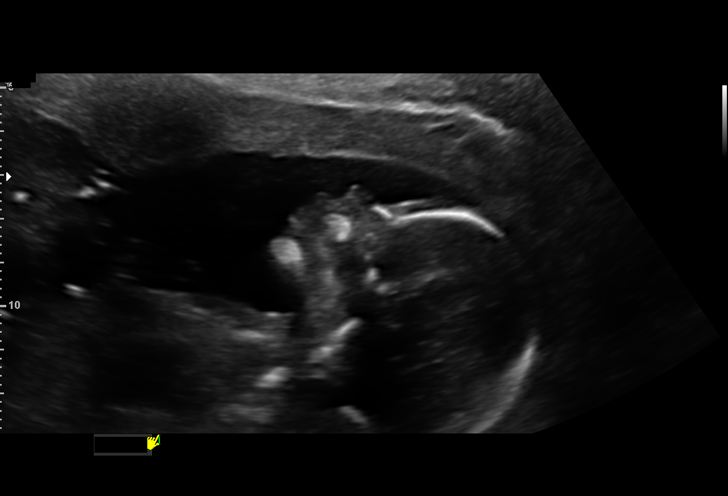
[im 78/81]
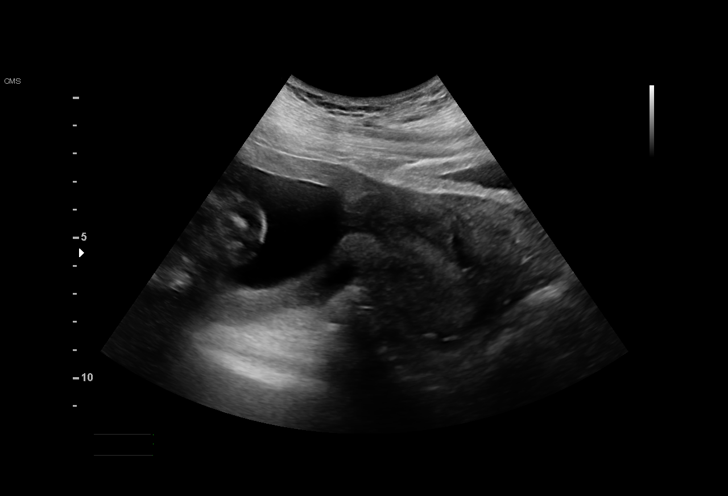

[13 of 28 positions shown; findings below may reference images not displayed]

am)

[address]
[place]; [HOSPITAL]

Indications

20 weeks gestation of pregnancy
Encounter for fetal anatomic survey
Abnormal biochemical screen (quad) for
Trisomy 21 ([DATE])
Hypertension - Chronic/Pre-existing
(labetalol)
Uterine fibroids affecting pregnancy in        O34.12,
second trimester, antepartum
Obesity complicating pregnancy, second
trimester
Advanced maternal age multigravida 35+,
second trimester
OB History

Gravidity:    3         Term:   2        Prem:   0        SAB:   0
TOP:          0       Ectopic:  0        Living: 2
Fetal Evaluation

Num Of Fetuses:     1
Fetal Heart         154
Rate(bpm):
Cardiac Activity:   Observed
Presentation:       Cephalic
Placenta:           Right lateral, above cervical os
P. Cord Insertion:  Visualized
Amniotic Fluid
AFI FV:      Subjectively within normal limits

Largest Pocket(cm)
8.1
Biometry

BPD:      42.8  mm     G. Age:  19w 0d          9  %    CI:        66.46   %    70 - 86
FL/HC:      18.5   %    16.8 -
HC:      168.3  mm     G. Age:  19w 4d         16  %    HC/AC:      1.04        1.09 -
AC:      162.6  mm     G. Age:  21w 3d         80  %    FL/BPD:     72.7   %
FL:       31.1  mm     G. Age:  19w 4d         25  %    FL/AC:      19.1   %    20 - 24
HUM:      29.9  mm     G. Age:  19w 6d         44  %
CER:        20  mm     G. Age:  19w 0d         24  %

CM:        3.7  mm
Est. FW:     351  gm    0 lb 12 oz      53  %
Gestational Age

LMP:           20w 1d        Date:  07/19/16                 EDD:   04/25/17
U/S Today:     19w 6d                                        EDD:   04/27/17
Best:          20w 1d     Det. By:  LMP  (07/19/16)          EDD:   04/25/17
Anatomy

Cranium:               Appears normal         Aortic Arch:            Appears normal
Cavum:                 Appears normal         Ductal Arch:            Not well visualized
Ventricles:            Appears normal         Diaphragm:              Not well visualized
Choroid Plexus:        Appears normal         Stomach:                Appears normal, left
sided
Cerebellum:            Appears normal         Abdomen:                Appears normal
Posterior Fossa:       Appears normal         Abdominal Wall:         Appears nml (cord
insert, abd wall)
Nuchal Fold:           Not applicable (>20    Cord Vessels:           Appears normal (3
wks GA)                                        vessel cord)
Face:                  Appears normal         Kidneys:                Appear normal
(orbits and profile)
Lips:                  Appears normal         Bladder:                Appears normal
Thoracic:              Appears normal         Spine:                  Limited views
appear normal
Heart:                 Not well visualized    Upper Extremities:      Appears normal
RVOT:                  Not well visualized    Lower Extremities:      Appears normal
LVOT:                  Appears normal

Other:  Parents do not wish to know sex of fetus. Heels and 5th digit
visualized. Nasal bone visualized. Open hands visualized. Technically
difficult due to maternal habitus and fibroids.
Cervix Uterus Adnexa

Cervix
Length:            4.3  cm.
Normal appearance by transabdominal scan.

Uterus
Multiple fibroids noted, see table below.
Left Ovary
Not visualized.

Right Ovary
Within normal limits.

Cul De Sac:   No free fluid seen.

Adnexa:       No abnormality visualized.
Myomas

Site                     L(cm)      W(cm)      D(cm)      Location
Anterior                 4
Anterior Left            6.5        6.3        5
Posterior Right

Blood Flow                 RI        PI       Comments

Impression

Singleton intrauterine pregnancy at 20+1 weeks with AMA
and fibroids. MSAFP abnormal but wrong dates used
Review of the anatomy shows no sonographic markers for
aneuploidy or structural anomalies
However, cardiac evaluation should be considered
suboptimal secondary to the uterine fibroids noted above
Amniotic fluid volume is normal
Estimated fetal weight is 351g which is growth in the 53rd
percentile
Recommendations

To see genetic counselor today. MSAFP recalculation is
pending
Repeat scan in 4 weeks to complete anatomic survey

## 2018-04-24 IMAGING — US US MFM OB FOLLOW-UP
1 series · 13 of 28 positions shown · non-contrast
Comparison: none

[Series 1: us mfm ob follow-up · 59 acquisitions, 13 frames shown]
[im 3/59]
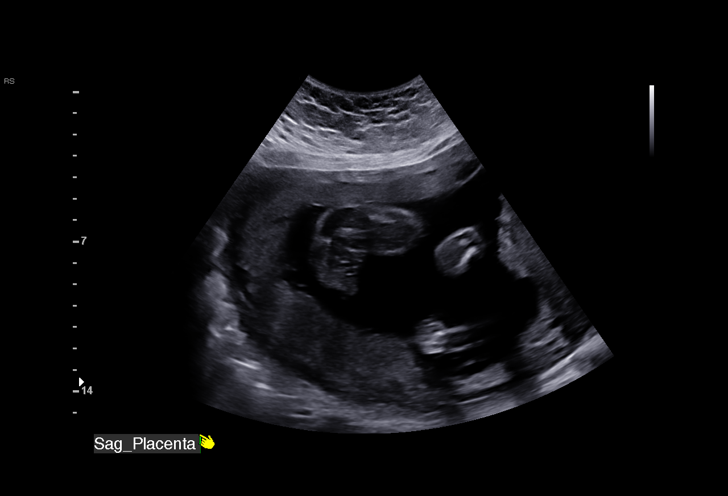
[im 7/59]
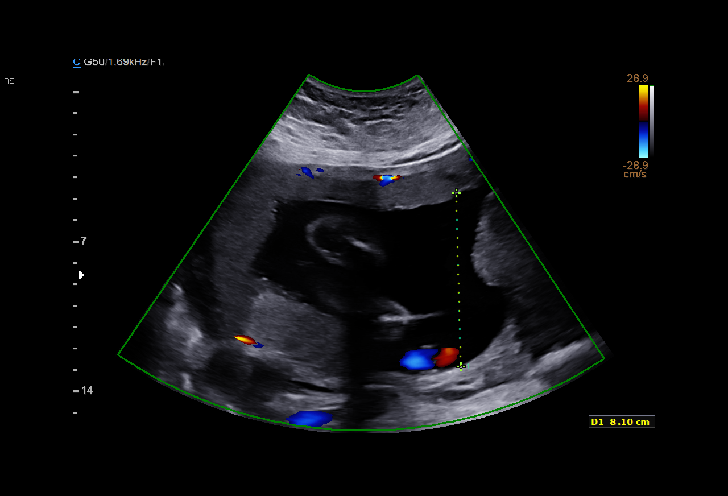
[im 11/59]
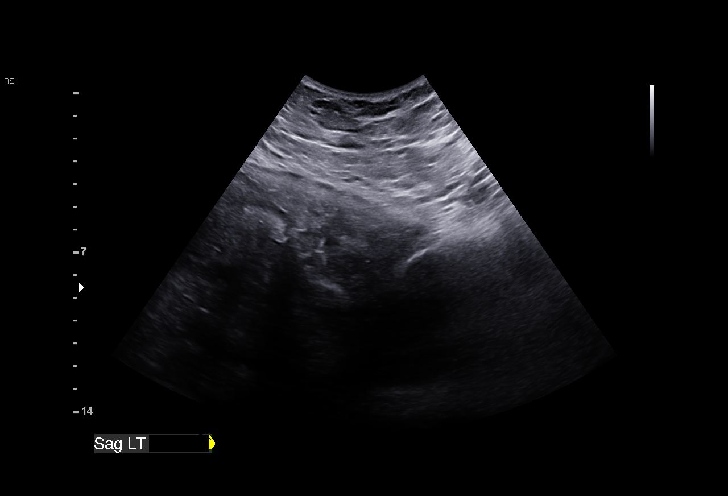
[im 16/59]
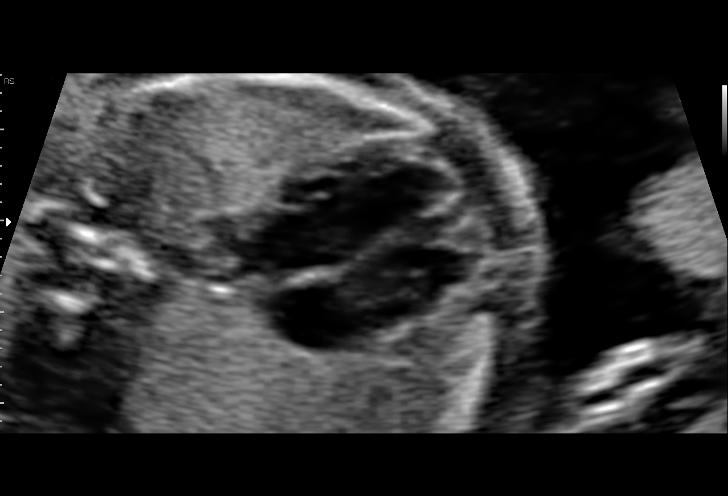
[im 20/59]
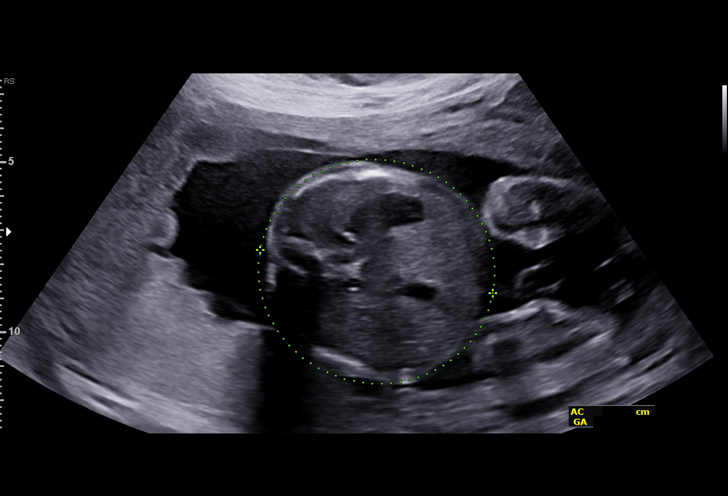
[im 24/59]
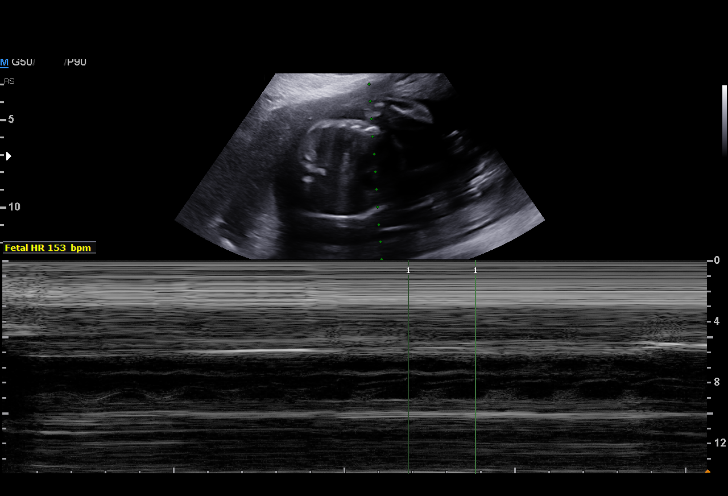
[im 31/59]
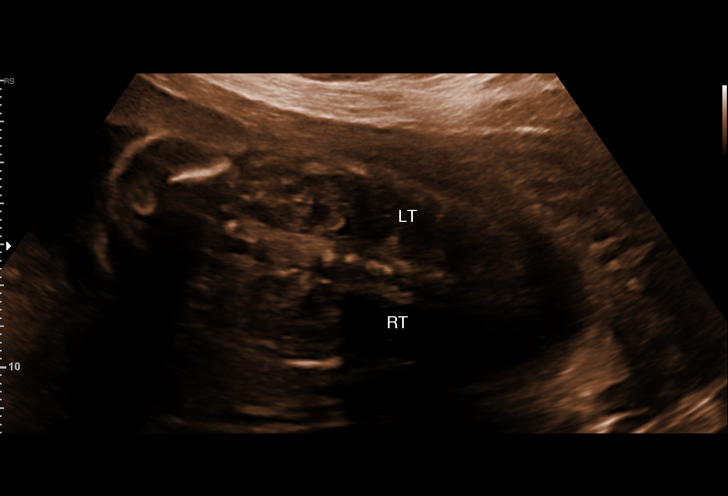
[im 35/59]
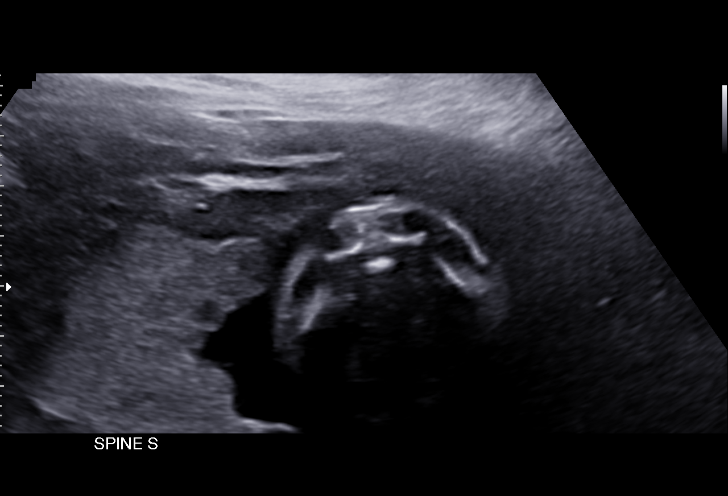
[im 39/59]
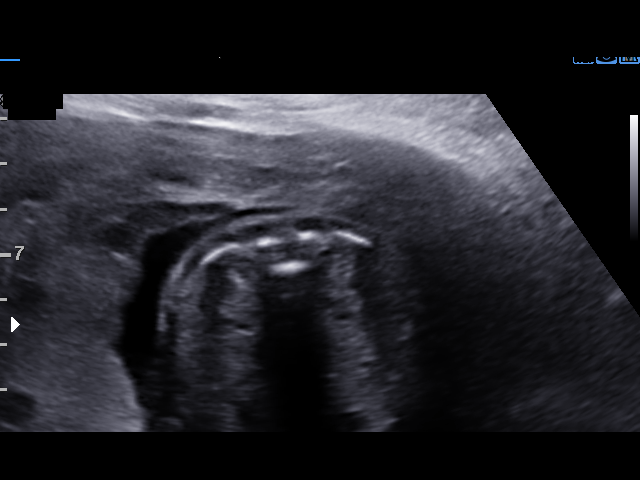
[im 43/59]
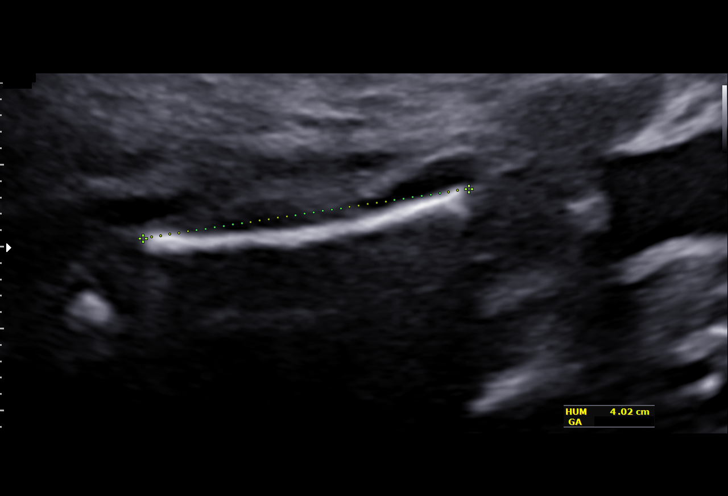
[im 48/59]
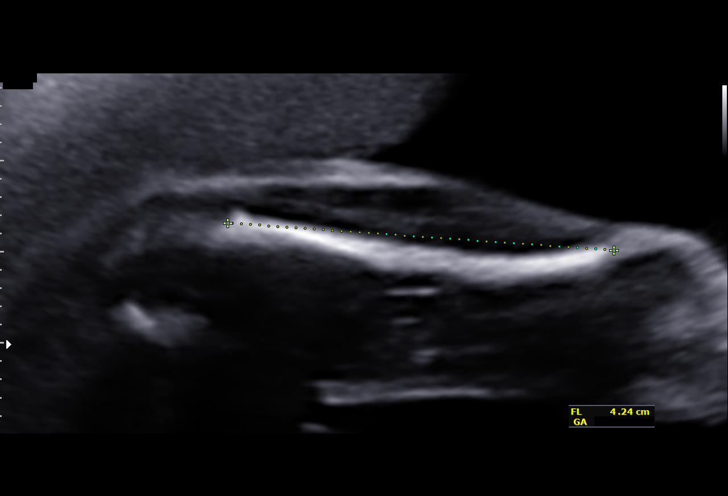
[im 52/59]
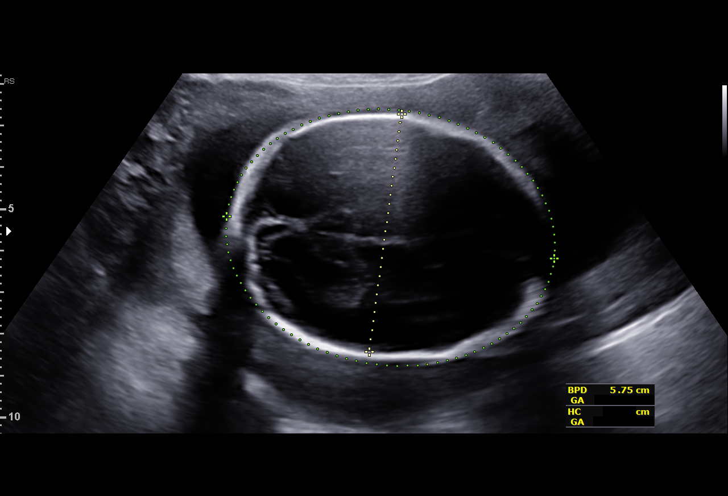
[im 56/59]
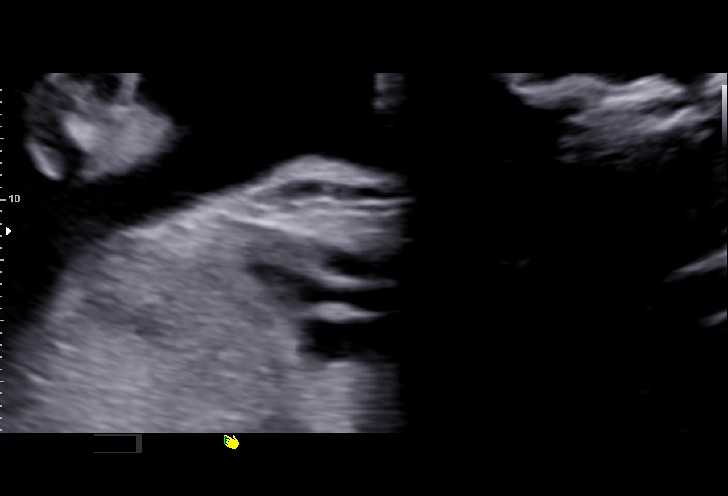

[13 of 28 positions shown; findings below may reference images not displayed]

1  ABHINAW SODA              [id number]      [id number]     [id number]
Indications

24 weeks gestation of pregnancy
Abnormal biochemical screen (quad) for
Trisomy 21 ([DATE])
Hypertension - Chronic/Pre-existing
(labetalol)
Uterine fibroids affecting pregnancy in        O34.12,
second trimester, antepartum
Obesity complicating pregnancy, second
trimester
Advanced maternal age multigravida 35+,
second trimester
Encounter for other antenatal screening
follow-up
OB History

Gravidity:    3         Term:   2        Prem:   0        SAB:   0
TOP:          0       Ectopic:  0        Living: 2
Fetal Evaluation

Num Of Fetuses:     1
Fetal Heart         153
Rate(bpm):
Cardiac Activity:   Observed
Presentation:       Cephalic
Placenta:           Anterior Fundal, above cervical os
P. Cord Insertion:  Previously Visualized
Amniotic Fluid
AFI FV:      Subjectively within normal limits

Largest Pocket(cm)
8.10
Biometry

BPD:      57.6  mm     G. Age:  23w 4d         17  %    CI:        70.28   %   70 - 86
FL/HC:      19.6   %   18.7 -
HC:      219.1  mm     G. Age:  23w 6d         17  %    HC/AC:      1.05       1.05 -
AC:      208.4  mm     G. Age:  25w 3d         71  %    FL/BPD:     74.7   %   71 - 87
FL:         43  mm     G. Age:  24w 1d         26  %    FL/AC:      20.6   %   20 - 24
HUM:      39.9  mm     G. Age:  24w 2d         39  %

Est. FW:     723  gm    1 lb 10 oz      57  %
Gestational Age

LMP:           24w 3d       Date:   07/19/16                 EDD:   04/25/17
U/S Today:     24w 2d                                        EDD:   04/26/17
Best:          24w 3d    Det. By:   LMP  (07/19/16)          EDD:   04/25/17
Anatomy

Cranium:               Appears normal         Aortic Arch:            Previously seen
Cavum:                 Previously seen        Ductal Arch:            Not well visualized
Ventricles:            Appears normal         Diaphragm:              Appears normal
Choroid Plexus:        Previously seen        Stomach:                Appears normal, left
sided
Cerebellum:            Previously seen        Abdomen:                Appears normal
Posterior Fossa:       Previously seen        Abdominal Wall:         Previously seen
Nuchal Fold:           Not applicable (>20    Cord Vessels:           Previously seen
wks GA)
Face:                  Orbits and profile     Kidneys:                Appear normal
previously seen
Lips:                  Previously seen        Bladder:                Appears normal
Thoracic:              Appears normal         Spine:                  Limited views
appear normal
Heart:                 Appears normal         Upper Extremities:      Previously seen
(4CH, axis, and
situs)
RVOT:                  Not well visualized    Lower Extremities:      Previously seen
LVOT:                  Previously seen

Other:  Fetus appears to be a female. Heels and 5th digit previously
visualized. Nasal bone previously visualized. Open hands previously
visualized Technically difficult due to maternal habitus and fibroids.
Cervix Uterus Adnexa

Cervix
Length:           6.15  cm.
Normal appearance by transabdominal scan.

Uterus
No abnormality visualized.

Left Ovary
Not visualized.
Right Ovary
Not visualized.

Adnexa:       No abnormality visualized. No adnexal mass
visualized.
Myomas

Site                     L(cm)      W(cm)      D(cm)      Location
Posterior
Anterior, left
Anterior

Blood Flow                 RI        PI       Comments
Previously seen

Impression

Single living intrauterine pregnancy at 78w2d.
Cephalic presentation.
Anterior placenta without evidence of previa.
Appropriate interval fetal growth (57%).
Normal amniotic fluid volume.
Normal interval fetal anatomy.
Limited views of RVOT, ductal arch, sacral spine.
Recommendations

Given hypertension I recommend serial US for growth and
antenatal testing to begin at 32 weeks.
Fetal anatomy may be reassessed at that time.

## 2018-04-29 ENCOUNTER — Emergency Department (HOSPITAL_COMMUNITY)
Admission: EM | Admit: 2018-04-29 | Discharge: 2018-04-29 | Discharge status: Against Medical Advice | Payer: 59 | Attending: Emergency Medicine | Admitting: Emergency Medicine

## 2018-04-29 ENCOUNTER — Encounter (HOSPITAL_COMMUNITY): Payer: Self-pay | Admitting: Emergency Medicine

## 2018-04-29 ENCOUNTER — Other Ambulatory Visit: Payer: Self-pay

## 2018-04-29 DIAGNOSIS — Z87891 Personal history of nicotine dependence: Secondary | ICD-10-CM | POA: Insufficient documentation

## 2018-04-29 DIAGNOSIS — K0889 Other specified disorders of teeth and supporting structures: Secondary | ICD-10-CM

## 2018-04-29 DIAGNOSIS — J45909 Unspecified asthma, uncomplicated: Secondary | ICD-10-CM | POA: Diagnosis not present

## 2018-04-29 DIAGNOSIS — I10 Essential (primary) hypertension: Secondary | ICD-10-CM | POA: Diagnosis not present

## 2018-04-29 DIAGNOSIS — Z79899 Other long term (current) drug therapy: Secondary | ICD-10-CM | POA: Diagnosis not present

## 2018-04-29 MED ORDER — ACETAMINOPHEN-CODEINE #3 300-30 MG PO TABS: 1.0000 | ORAL_TABLET | Freq: Three times a day (TID) | ORAL | 0 refills | Status: AC | PRN

## 2018-04-29 MED ORDER — MELOXICAM 15 MG PO TABS: 15.0000 mg | ORAL_TABLET | Freq: Every day | ORAL | 0 refills | Status: AC

## 2018-04-29 MED ORDER — PENICILLIN V POTASSIUM 250 MG PO TABS
500.0000 mg | ORAL_TABLET | Freq: Once | ORAL | Status: AC
  Administered 2018-04-29: 500 mg via ORAL
  Filled 2018-04-29: qty 2

## 2018-04-29 MED ORDER — PENICILLIN V POTASSIUM 500 MG PO TABS: 500.0000 mg | ORAL_TABLET | Freq: Four times a day (QID) | ORAL | 0 refills | Status: AC

## 2018-04-29 NOTE — Discharge Instructions (Addendum)
Please take all of your antibiotics until finished!   You may develop abdominal discomfort or diarrhea from the antibiotic.  You may help offset this with probiotics which you can buy or get in yogurt. Do not eat  or take the probiotics until 2 hours after your antibiotic.   Apply warm compresses to jaw throughout the day.  Take Mobic as prescribed.  If this causes any upset stomach issues please stop taking this medication and call your primary doctor for further recommendations.  You can take 500 to 1000 mg of Tylenol every 6 hours as needed for pain.  You can take Tylenol with codeine as needed for severe pain but do not drive, drink alcohol, or operate heavy machinery while taking this medicine as it may make you drowsy.  You may also use warm water salt gargles, Orajel, or other over-the-counter dental pain remedies. Followup with a dentist is very important for ongoing evaluation and management of recurrent dental pain. Return to emergency department for emergent changing or worsening symptoms such as fever, worsening facial swelling, difficulty breathing or swallowing, throat tightness, or vision changes.  If your blood pressure (BP) was elevated on multiple readings during this visit above 130 for the top number or above 80 for the bottom number, please have this repeated by your primary care provider within one month. You can also check your blood pressure when you are out at a pharmacy or grocery store. Many have machines that will check your blood pressure.  If your blood pressure remains elevated, please follow-up with your PCP.

## 2018-04-29 NOTE — ED Notes (Signed)
Patient speaking with PA regarding patients elevated blood pressure. Patient states she would rather not stay, would take her home BP medication when she got home and would return if she had any worsening symptoms.

## 2018-04-29 NOTE — ED Triage Notes (Signed)
Pt reports left upper dental pain for the past few days, has been taking ibuprofen without relief. Supposed to have a tooth pulled soon but states she is having pain in a different one

## 2018-04-29 NOTE — ED Provider Notes (Addendum)
[facility] Provider Note   CSN: [id number] Arrival date & time: 04/29/18  5621     History   Chief Complaint Chief Complaint  Patient presents with  . Dental Pain    HPI Sylvia Baxter is a 37 y.o. female with history of asthma, diabetes, hypertension presents for evaluation of acute onset, constant left maxillary dental pain for 4 days.  She states that she has a history of a cracked molar and is scheduled to follow-up with a dentist in October for this but 4 days ago developed dental pain overlying a different tooth.  Pain is constant, throbbing, radiates up to the left side of the forehead.  Pain worsens with exposure to hot or cold foods and air.  Denies fevers, facial swelling, pain with eye movements, nausea, vomiting.  She states that she has gone through 40 tablets of ibuprofen in 4 days.  Denies abdominal pain, hematemesis, hematochezia or melena.  Also has tried Orajel without significant relief.  She does note intermittent frontal headaches which she states are consistent with headaches she has experienced in the past when she is tired.  Denies headache at this time.  Denies vision changes, numbness, weakness, chest pain, shortness of breath, or decreased urine output.  She has not taken her blood pressure medicine this morning.  She takes valsartan and is typically compliant with this medicine.  The history is provided by the patient.    Past Medical History:  Diagnosis Date  . Asthma   . Genital herpes   . Gestational diabetes   . Hypertension     Patient Active Problem List   Diagnosis Date Noted  . Pre-eclampsia 03/31/2017  . Adjustment disorder with depressed mood 03/25/2017  . [redacted] weeks gestation of pregnancy   . Abnormal quad screen   . Advanced maternal age in multigravida, second trimester     Past Surgical History:  Procedure Laterality Date  . CESAREAN SECTION     x2  . CESAREAN SECTION N/A 03/31/2017   Procedure: CESAREAN SECTION;  Surgeon: Molli Posey, MD;  Location: [facility];  Service: Obstetrics;  Laterality: N/A;  . MOUTH SURGERY       OB History    Gravida  3   Para  3   Term  2   Preterm  1   AB  0   Living  3     SAB      TAB      Ectopic      Multiple  0   Live Births  3            Home Medications    Prior to Admission medications   Medication Sig Start Date End Date Taking? Authorizing Provider  acetaminophen-codeine (TYLENOL #3) 300-30 MG tablet Take 1 tablet by mouth every 8 (eight) hours as needed for severe pain. 04/29/18   Marielis Samara A, PA-C  albuterol (PROVENTIL HFA;VENTOLIN HFA) 108 (90 BASE) MCG/ACT inhaler Inhale 2 puffs into the lungs every 4 (four) hours as needed for wheezing or shortness of breath. 08/12/14   Harden Mo, MD  calcium carbonate (TUMS - DOSED IN MG ELEMENTAL CALCIUM) 500 MG chewable tablet Chew 1 tablet by mouth daily.    [provider]  docusate sodium (COLACE) 100 MG capsule Take 1 capsule (100 mg total) by mouth 2 (two) times daily. 04/03/17   Tyson Dense, MD  ferrous sulfate 325 (65 FE) MG EC tablet Take 1  tablet (325 mg total) by mouth 2 (two) times daily. 04/03/17   Tyson Dense, MD  Hydrocodone-Acetaminophen 7.5-300 MG TABS Take 1 tablet by mouth every 6 (six) hours. 04/03/17   Tyson Dense, MD  penicillin v potassium (VEETID) 500 MG tablet Take 1 tablet (500 mg total) by mouth 4 (four) times daily for 7 days. 04/29/18 05/06/18  Rodell Perna A, PA-C  sertraline (ZOLOFT) 25 MG tablet Take 1 tablet once a day 01/20/17   [provider]    Family History No family history on file.  Social History Social History   Tobacco Use  . Smoking status: Former Smoker    Packs/day: 0.50    Types: Cigarettes    Last attempt to quit: 12/07/2016    Years since quitting: 1.3  . Smokeless tobacco: Never Used  Substance Use Topics  . Alcohol use: Yes  . Drug use: Yes     Types: Marijuana     Allergies   Kiwi extract   Review of Systems Review of Systems  Constitutional: Negative for chills and fever.  HENT: Positive for dental problem. Negative for facial swelling and trouble swallowing.   Respiratory: Negative for shortness of breath.   Cardiovascular: Negative for chest pain.  Gastrointestinal: Negative for abdominal pain, nausea and vomiting.  Genitourinary: Negative for decreased urine volume, difficulty urinating and dysuria.  Neurological: Negative for headaches.  All other systems reviewed and are negative.    Physical Exam Updated Vital Signs BP (!) 198/135 (BP Location: Right Arm)   Pulse 67   Temp 98.6 F (37 C) (Oral)   Resp 18   LMP 04/24/2018 (Exact Date)   SpO2 100%   Physical Exam  Constitutional: She appears well-developed and well-nourished. No distress.  HENT:  Head: Normocephalic and atraumatic.  Moderately decaying dentition with cracked left maxillary molar.  Tender to percussion overlying the left second premolar. Dentition appears to be stable. No noted area of swelling or fluctuance. No trismus. Mouth opening to at least 3 finger widths. Handles oral secretions without difficulty. No noted facial swelling. No swelling or tenderness to the submental or submandibular regions. No swelling or tenderness into the soft tissues of the neck.     Eyes: Pupils are equal, round, and reactive to light. Conjunctivae and EOM are normal. Right eye exhibits no discharge. Left eye exhibits no discharge.  No pain with EOMs  Neck: Normal range of motion and full passive range of motion without pain. Neck supple. No JVD present. No tracheal deviation present.  Cardiovascular: Normal rate.  Pulmonary/Chest: Effort normal.  Abdominal: She exhibits no distension.  Musculoskeletal: She exhibits no edema.  Lymphadenopathy:    She has no cervical adenopathy.  Neurological: She is alert. No cranial nerve deficit or sensory  deficit. She exhibits normal muscle tone.  Fluent speech no evidence of dysarthria or aphasia, no facial droop.  Cranial nerves appear grossly intact.  Moves extremity spontaneously with good strength.  Ambulatory without difficulty.  No ataxia noted.  Skin: Skin is warm and dry. No erythema.  Psychiatric: She has a normal mood and affect. Her behavior is normal.  Nursing note and vitals reviewed.    ED Treatments / Results  Labs (all labs ordered are listed, but only abnormal results are displayed) Labs Reviewed - No data to display  EKG None  Radiology No results found.  Procedures Procedures (including critical care time)  Medications Ordered in ED Medications  penicillin v potassium (VEETID) tablet 500 mg (has  no administration in time range)     Initial Impression / Assessment and Plan / ED Course  I have reviewed the triage vital signs and the nursing notes.  Pertinent labs & imaging results that were available during my care of the patient were reviewed by me and considered in my medical decision making (see chart for details).     Patient with toothache.  No gross abscess.  She is afebrile, hypertensive in the ED but has not taken her blood pressure medicine this morning.  No headache presently, normal neuro examination.  No other signs concerning for possible endorgan damage.  Expressed to the patient that it is extremely important that she take her blood pressure medicine and monitor it over the next few days to ensure that it does not remain elevated.  Exam unconcerning for Ludwig's angina or spread of infection.  Will treat with penicillin and pain medicine.  Discussed appropriate use of Tylenol #3.  She has been taking a large amount of ibuprofen over the past 4 days.  Advised her to discontinue this and start taking Mobic or start using of PPI with anti-inflammatories.She has follow-up scheduled with a dentist in October and I instructed her to call them to see if she  can move her appointment up.  Discussed strict ED return precautions.     8:45AM Patient up for discharge.  On reevaluation, blood pressure is 200/135.  Discussed that we should attempt to get her blood pressure under control in the ED even though she is currently asymptomatic.  Patient does not want to stay for management of her blood pressure.  She states that she is typically compliant with her medicines and has a blood pressure cuff at home and would like to attempt to lower her blood pressure at home.  She understands that she is leaving [facility].  Patient understands that her actions will lead to inadequate medical workup, and that she is at risk of complications of missed diagnosis, which includes morbidity and mortality.  Alternative options discussed. Opportunity to change mind given. Discussion witnessed by RN Corliss Parish. Patient is demonstrating good capacity to make decision. Patient understands that she needs to return to the ER immediately if her symptoms get worse.  We discussed strict ED return precautions.  She verbalizes understanding of and agreement with this. Discussed with Dr. Roderic Palau.   Final Clinical Impressions(s) / ED Diagnoses   Final diagnoses:  Pain, dental    ED Discharge Orders         Ordered    penicillin v potassium (VEETID) 500 MG tablet  4 times daily     04/29/18 0838    acetaminophen-codeine (TYLENOL #3) 300-30 MG tablet  Every 8 hours PRN     04/29/18 0838            Renita Papa, PA-C 04/29/18 [id number]    Milton Ferguson, MD 04/30/18 [phone]
